# Patient Record
Sex: Male | Born: 1990 | Race: White | Hispanic: No | Marital: Single | Smoking: Former smoker
Health system: Southern US, Community
[De-identification: ages and names within clinical notes are randomized; demographics above are authoritative.]

## PROBLEM LIST (undated history)

## (undated) DIAGNOSIS — F32A Depression, unspecified: Secondary | ICD-10-CM

## (undated) DIAGNOSIS — S060X9A Concussion with loss of consciousness of unspecified duration, initial encounter: Secondary | ICD-10-CM

## (undated) DIAGNOSIS — T7840XA Allergy, unspecified, initial encounter: Secondary | ICD-10-CM

## (undated) DIAGNOSIS — K219 Gastro-esophageal reflux disease without esophagitis: Secondary | ICD-10-CM

## (undated) DIAGNOSIS — F419 Anxiety disorder, unspecified: Secondary | ICD-10-CM

## (undated) DIAGNOSIS — S060XAA Concussion with loss of consciousness status unknown, initial encounter: Secondary | ICD-10-CM

## (undated) HISTORY — DX: Allergy, unspecified, initial encounter: T78.40XA

## (undated) HISTORY — DX: Anxiety disorder, unspecified: F41.9

## (undated) HISTORY — DX: Depression, unspecified: F32.A

---

## 2017-10-13 ENCOUNTER — Emergency Department
Admission: EM | Admit: 2017-10-13 | Discharge: 2017-10-13 | Disposition: A | Discharge status: Home / Self Care | Payer: Self-pay | Attending: Student in an Organized Health Care Education/Training Program | Admitting: Student in an Organized Health Care Education/Training Program

## 2017-10-13 ENCOUNTER — Other Ambulatory Visit: Payer: Self-pay

## 2017-10-13 ENCOUNTER — Encounter: Payer: Self-pay | Admitting: Emergency Medicine

## 2017-10-13 DIAGNOSIS — R112 Nausea with vomiting, unspecified: Secondary | ICD-10-CM | POA: Insufficient documentation

## 2017-10-13 DIAGNOSIS — Z87891 Personal history of nicotine dependence: Secondary | ICD-10-CM | POA: Insufficient documentation

## 2017-10-13 HISTORY — DX: Concussion with loss of consciousness status unknown, initial encounter: S06.0XAA

## 2017-10-13 HISTORY — DX: Concussion with loss of consciousness of unspecified duration, initial encounter: S06.0X9A

## 2017-10-13 LAB — URINALYSIS, COMPLETE (UACMP) WITH MICROSCOPIC
BACTERIA UA: NONE SEEN
Bilirubin Urine: NEGATIVE
Glucose, UA: NEGATIVE mg/dL
HGB URINE DIPSTICK: NEGATIVE
Ketones, ur: 20 mg/dL — AB
Leukocytes, UA: NEGATIVE
Nitrite: NEGATIVE
PROTEIN: NEGATIVE mg/dL
Specific Gravity, Urine: 1.019 (ref 1.005–1.030)
pH: 6 (ref 5.0–8.0)

## 2017-10-13 LAB — COMPREHENSIVE METABOLIC PANEL
ALT: 32 U/L (ref 17–63)
AST: 24 U/L (ref 15–41)
Albumin: 4.8 g/dL (ref 3.5–5.0)
Alkaline Phosphatase: 62 U/L (ref 38–126)
Anion gap: 9 (ref 5–15)
BUN: 15 mg/dL (ref 6–20)
CO2: 24 mmol/L (ref 22–32)
Calcium: 9.8 mg/dL (ref 8.9–10.3)
Chloride: 105 mmol/L (ref 101–111)
Creatinine, Ser: 1.01 mg/dL (ref 0.61–1.24)
GFR calc Af Amer: >60 mL/min (ref >60)
GFR calc non Af Amer: >60 mL/min (ref >60)
Glucose, Bld: 116 mg/dL — ABNORMAL HIGH (ref 65–99)
Potassium: 3.9 mmol/L (ref 3.5–5.1)
Sodium: 138 mmol/L (ref 135–145)
Total Bilirubin: 1.3 mg/dL — ABNORMAL HIGH (ref 0.3–1.2)
Total Protein: 8.1 g/dL (ref 6.5–8.1)

## 2017-10-13 LAB — LIPASE, BLOOD: Lipase: 17 U/L (ref 11–51)

## 2017-10-13 LAB — CBC
HCT: 48.6 % (ref 40.0–52.0)
Hemoglobin: 16.8 g/dL (ref 13.0–18.0)
MCH: 31.9 pg (ref 26.0–34.0)
MCHC: 34.5 g/dL (ref 32.0–36.0)
MCV: 92.2 fL (ref 80.0–100.0)
Platelets: 208 10*3/uL (ref 150–440)
RBC: 5.27 MIL/uL (ref 4.40–5.90)
RDW: 12.6 % (ref 11.5–14.5)
WBC: 10.7 10*3/uL — ABNORMAL HIGH (ref 3.8–10.6)

## 2017-10-13 MED ORDER — SODIUM CHLORIDE 0.9 % IV BOLUS (SEPSIS)
1000.0000 mL | Freq: Once | INTRAVENOUS | Status: AC
  Administered 2017-10-13: 1000 mL via INTRAVENOUS

## 2017-10-13 MED ORDER — PROMETHAZINE HCL 12.5 MG PO TABS: 12.5000 mg | ORAL_TABLET | Freq: Four times a day (QID) | ORAL | 0 refills | Status: DC | PRN

## 2017-10-13 MED ORDER — PROMETHAZINE HCL 25 MG/ML IJ SOLN: 12.5000 mg | Freq: Four times a day (QID) | INTRAMUSCULAR | Status: DC | PRN

## 2017-10-13 NOTE — ED Provider Notes (Signed)
[facility] Emergency Department Provider Note    First MD Initiated Contact with Patient 10/13/17 1457     (approximate)  I have reviewed the triage vital signs and the nursing notes.   HISTORY  Chief Complaint Emesis    HPI Brian Gordon is a 27 y.o. male presents with 3 days of nausea vomiting and feelings of dehydration.  No Diarrhea.  No fevers.  States that he has had several friends with similar symptoms.  No chest pain or shortness of breath.  Has not tried anything for nausea.  Patient woke up this morning and was feeling lightheaded when standing and was worried that he was dehydrated.  He tried to drink half a gallon of milk which caused him to vomit.  Past Medical History:  Diagnosis Date  . Concussion    History reviewed. No pertinent family history. History reviewed. No pertinent surgical history. There are no active problems to display for this patient.     Prior to Admission medications   Not on File    Allergies Cherry    Social History Social History   Tobacco Use  . Smoking status: Former Games developer  . Smokeless tobacco: Never Used  Substance Use Topics  . Alcohol use: Yes    Frequency: Never  . Drug use: No    Review of Systems Patient denies headaches, rhinorrhea, blurry vision, numbness, shortness of breath, chest pain, edema, cough, abdominal pain, nausea, vomiting, diarrhea, dysuria, fevers, rashes or hallucinations unless otherwise stated above in HPI. ____________________________________________   PHYSICAL EXAM:  VITAL SIGNS: Vitals:   10/13/17 1421  BP: 129/81  Pulse: (!) 103  Resp: 16  Temp: 98.1 F (36.7 C)  SpO2: 96%    Constitutional: Alert and oriented. Well appearing and in no acute distress. Eyes: Conjunctivae are normal.  Head: Atraumatic. Nose: No congestion/rhinnorhea. Mouth/Throat: Mucous membranes are moist.   Neck: Painless ROM.  Cardiovascular:   Good peripheral  circulation. Respiratory: Normal respiratory effort.  No retractions.  Gastrointestinal: Soft and nontender.  Musculoskeletal: No lower extremity tenderness .  No joint effusions. Neurologic:  Normal speech and language. No gross focal neurologic deficits are appreciated.  Skin:  Skin is warm, dry and intact. No rash noted. Psychiatric: Mood and affect are normal. Speech and behavior are normal.  ____________________________________________   LABS (all labs ordered are listed, but only abnormal results are displayed)  Results for orders placed or performed during the hospital encounter of 10/13/17 (from the past 24 hour(s))  Lipase, blood     Status: None   Collection Time: 10/13/17  2:20 PM  Result Value Ref Range   Lipase 17 11 - 51 U/L  Comprehensive metabolic panel     Status: Abnormal   Collection Time: 10/13/17  2:20 PM  Result Value Ref Range   Sodium 138 135 - 145 mmol/L   Potassium 3.9 3.5 - 5.1 mmol/L   Chloride 105 101 - 111 mmol/L   CO2 24 22 - 32 mmol/L   Glucose, Bld 116 (H) 65 - 99 mg/dL   BUN 15 6 - 20 mg/dL   Creatinine, Ser 1.61 0.61 - 1.24 mg/dL   Calcium 9.8 8.9 - 09.6 mg/dL   Total Protein 8.1 6.5 - 8.1 g/dL   Albumin 4.8 3.5 - 5.0 g/dL   AST 24 15 - 41 U/L   ALT 32 17 - 63 U/L   Alkaline Phosphatase 62 38 - 126 U/L   Total Bilirubin 1.3 (H) 0.3 -  1.2 mg/dL   GFR calc non Af Amer >60 >60 mL/min   GFR calc Af Amer >60 >60 mL/min   Anion gap 9 5 - 15  CBC     Status: Abnormal   Collection Time: 10/13/17  2:20 PM  Result Value Ref Range   WBC 10.7 (H) 3.8 - 10.6 K/uL   RBC 5.27 4.40 - 5.90 MIL/uL   Hemoglobin 16.8 13.0 - 18.0 g/dL   HCT 86.548.6 78.440.0 - 69.652.0 %   MCV 92.2 80.0 - 100.0 fL   MCH 31.9 26.0 - 34.0 pg   MCHC 34.5 32.0 - 36.0 g/dL   RDW 29.512.6 28.411.5 - 13.214.5 %   Platelets 208 150 - 440 K/uL  Urinalysis, Complete w Microscopic     Status: Abnormal   Collection Time: 10/13/17  2:20 PM  Result Value Ref Range   Color, Urine YELLOW (A) YELLOW    APPearance CLEAR (A) CLEAR   Specific Gravity, Urine 1.019 1.005 - 1.030   pH 6.0 5.0 - 8.0   Glucose, UA NEGATIVE NEGATIVE mg/dL   Hgb urine dipstick NEGATIVE NEGATIVE   Bilirubin Urine NEGATIVE NEGATIVE   Ketones, ur 20 (A) NEGATIVE mg/dL   Protein, ur NEGATIVE NEGATIVE mg/dL   Nitrite NEGATIVE NEGATIVE   Leukocytes, UA NEGATIVE NEGATIVE   RBC / HPF 0-5 0 - 5 RBC/hpf   WBC, UA 0-5 0 - 5 WBC/hpf   Bacteria, UA NONE SEEN NONE SEEN   Squamous Epithelial / LPF 0-5 (A) NONE SEEN   Mucus PRESENT    ____________________________________________ ____________________________________________   PROCEDURES  Procedure(s) performed:  Procedures    Critical Care performed: no ____________________________________________   INITIAL IMPRESSION / ASSESSMENT AND PLAN / ED COURSE  Pertinent labs & imaging results that were available during my care of the patient were reviewed by me and considered in my medical decision making (see chart for details).  DDX: Dehydration, enteritis, gastritis, food poisoning, appendicitis  Brian Gordon is a 27 y.o. who presents to the ED with his as described above.  Symptoms improved with IV fluids and IV antiemetics.  Blood work is reassuring.  Not clinically consistent with appendicitis or acute intra-abdominal process.  Patient was able to tolerate PO and was able to ambulate with a steady gait.  Have discussed with the patient and available family all diagnostics and treatments performed thus far and all questions were answered to the best of my ability. The patient demonstrates understanding and agreement with plan.       ____________________________________________   FINAL CLINICAL IMPRESSION(S) / ED DIAGNOSES  Final diagnoses:  Intractable vomiting with nausea, unspecified vomiting type      NEW MEDICATIONS STARTED DURING THIS VISIT:  This SmartLink is deprecated. Use AVSMEDLIST instead to display the medication list for a  patient.   Note:  This document was prepared using Dragon voice recognition software and may include unintentional dictation errors.     Willy Eddyobinson, Dao Mearns, MD 10/13/17 1550

## 2017-10-13 NOTE — ED Notes (Signed)
FIRST NURSE NOTE: to ER via POV. Pt provided wheelchair upon entering.

## 2017-10-13 NOTE — ED Triage Notes (Signed)
Here for vomiting for last 3 days. Reports cannot keep anything down per pt. Ambulatory with steady gait. "I drank half a gallon of milk and it came back up.  No specific pain. "I know I am dehydrated"

## 2017-11-04 ENCOUNTER — Other Ambulatory Visit: Payer: Self-pay

## 2017-11-04 ENCOUNTER — Emergency Department
Admission: EM | Admit: 2017-11-04 | Discharge: 2017-11-04 | Disposition: A | Discharge status: Home / Self Care | Payer: Self-pay | Attending: Student in an Organized Health Care Education/Training Program | Admitting: Student in an Organized Health Care Education/Training Program

## 2017-11-04 ENCOUNTER — Emergency Department: Payer: Self-pay

## 2017-11-04 ENCOUNTER — Encounter: Payer: Self-pay | Admitting: Radiology

## 2017-11-04 DIAGNOSIS — Z23 Encounter for immunization: Secondary | ICD-10-CM | POA: Insufficient documentation

## 2017-11-04 DIAGNOSIS — Z87891 Personal history of nicotine dependence: Secondary | ICD-10-CM | POA: Insufficient documentation

## 2017-11-04 DIAGNOSIS — L03113 Cellulitis of right upper limb: Secondary | ICD-10-CM | POA: Insufficient documentation

## 2017-11-04 DIAGNOSIS — W5501XA Bitten by cat, initial encounter: Secondary | ICD-10-CM | POA: Insufficient documentation

## 2017-11-04 LAB — CBC WITH DIFFERENTIAL/PLATELET
BASOS ABS: 0 10*3/uL (ref 0–0.1)
BASOS PCT: 0 %
EOS ABS: 0 10*3/uL (ref 0–0.7)
Eosinophils Relative: 0 %
HCT: 46.9 % (ref 40.0–52.0)
HEMOGLOBIN: 16.5 g/dL (ref 13.0–18.0)
Lymphocytes Relative: 20 %
Lymphs Abs: 1.8 10*3/uL (ref 1.0–3.6)
MCH: 31.6 pg (ref 26.0–34.0)
MCHC: 35.2 g/dL (ref 32.0–36.0)
MCV: 89.8 fL (ref 80.0–100.0)
Monocytes Absolute: 0.8 10*3/uL (ref 0.2–1.0)
Monocytes Relative: 9 %
NEUTROS PCT: 71 %
Neutro Abs: 6.3 10*3/uL (ref 1.4–6.5)
PLATELETS: 226 10*3/uL (ref 150–440)
RBC: 5.23 MIL/uL (ref 4.40–5.90)
RDW: 12.1 % (ref 11.5–14.5)
WBC: 8.9 10*3/uL (ref 3.8–10.6)

## 2017-11-04 LAB — COMPREHENSIVE METABOLIC PANEL
ALBUMIN: 4.6 g/dL (ref 3.5–5.0)
ALK PHOS: 56 U/L (ref 38–126)
ALT: 28 U/L (ref 17–63)
AST: 21 U/L (ref 15–41)
Anion gap: 10 (ref 5–15)
BUN: 12 mg/dL (ref 6–20)
CALCIUM: 9.5 mg/dL (ref 8.9–10.3)
CHLORIDE: 104 mmol/L (ref 101–111)
CO2: 25 mmol/L (ref 22–32)
CREATININE: 0.9 mg/dL (ref 0.61–1.24)
GFR calc Af Amer: >60 mL/min (ref >60)
GFR calc non Af Amer: >60 mL/min (ref >60)
GLUCOSE: 101 mg/dL — AB (ref 65–99)
Potassium: 4.1 mmol/L (ref 3.5–5.1)
SODIUM: 139 mmol/L (ref 135–145)
Total Bilirubin: 1 mg/dL (ref 0.3–1.2)
Total Protein: 8 g/dL (ref 6.5–8.1)

## 2017-11-04 LAB — LACTIC ACID, PLASMA: LACTIC ACID, VENOUS: 0.9 mmol/L (ref 0.5–1.9)

## 2017-11-04 MED ORDER — TETANUS-DIPHTH-ACELL PERTUSSIS 5-2.5-18.5 LF-MCG/0.5 IM SUSP
0.5000 mL | Freq: Once | INTRAMUSCULAR | Status: AC
  Administered 2017-11-04: 0.5 mL via INTRAMUSCULAR
  Filled 2017-11-04: qty 0.5

## 2017-11-04 MED ORDER — PIPERACILLIN-TAZOBACTAM 4.5 G IVPB: 4.5000 g | Freq: Once | INTRAVENOUS | Status: DC

## 2017-11-04 MED ORDER — HYDROCODONE-ACETAMINOPHEN 5-325 MG PO TABS: 1.0000 | ORAL_TABLET | ORAL | 0 refills | Status: DC | PRN

## 2017-11-04 MED ORDER — SODIUM CHLORIDE 0.9 % IV BOLUS (SEPSIS)
1000.0000 mL | Freq: Once | INTRAVENOUS | Status: AC
  Administered 2017-11-04: 1000 mL via INTRAVENOUS

## 2017-11-04 MED ORDER — CLINDAMYCIN HCL 300 MG PO CAPS: 300.0000 mg | ORAL_CAPSULE | Freq: Four times a day (QID) | ORAL | 0 refills | Status: DC

## 2017-11-04 MED ORDER — PIPERACILLIN SOD-TAZOBACTAM SO 2.25 (2-0.25) G IV SOLR
Freq: Once | INTRAVENOUS | Status: AC
  Administered 2017-11-04: 21:00:00 via INTRAVENOUS
  Filled 2017-11-04: qty 4.5

## 2017-11-04 MED ORDER — MORPHINE SULFATE (PF) 4 MG/ML IV SOLN
4.0000 mg | Freq: Once | INTRAVENOUS | Status: AC
  Administered 2017-11-04: 4 mg via INTRAVENOUS
  Filled 2017-11-04: qty 1

## 2017-11-04 MED ORDER — ONDANSETRON HCL 4 MG/2ML IJ SOLN
4.0000 mg | Freq: Once | INTRAMUSCULAR | Status: AC
  Administered 2017-11-04: 4 mg via INTRAVENOUS
  Filled 2017-11-04: qty 2

## 2017-11-04 MED ORDER — IOPAMIDOL (ISOVUE-370) INJECTION 76%
75.0000 mL | Freq: Once | INTRAVENOUS | Status: AC | PRN
  Administered 2017-11-04: 75 mL via INTRAVENOUS
  Filled 2017-11-04: qty 75

## 2017-11-04 NOTE — ED Notes (Signed)
Pt to CT via stretcher accomp by CT tech 

## 2017-11-04 NOTE — ED Notes (Signed)
Pt sitting in exam room with no distress noted; reports approx day ago he was scratched by his cat; superficial scratches noted to left upper arm and right wrist; also small puncture just at palmar side base of right 5th finger; no redness/swelling/drainage but reports having pain to right hand; pt is unsure of last tetanus; st his cat is UTD on vaccines

## 2017-11-04 NOTE — ED Triage Notes (Signed)
Pt in with co cat bite to right hand small puncture wound to palm of right hand, co pain to area. Cat is up to date on vaccinations.

## 2017-11-04 NOTE — ED Provider Notes (Signed)
[facility] Emergency Department Provider Note  ____________________________________________  Time seen: Approximately 10:47 PM  I have reviewed the triage vital signs and the nursing notes.   HISTORY  Chief Complaint Animal Bite    HPI Brian Gordon is a 27 y.o. male who presents the emergency department complaining of a cat bite to his right hand.  Patient reports that he had picked up the family cat 2 days prior.  Patient reports that the cat is typically spastic if not held closely to the chest.  Patient reports that the cat became extremely agitated because he was not how close it.  The cat scratched and bit the patient's hand.  Initially, the patient washed on his hand did not think anything of it.  He reported some pain with flexion of the second digit, as there was a puncture wound just proximal to the MCP joint in this area.  No loss of range of motion.  Patient reports over the past 2 days he has had an increase in pain, increase in erythema and edema that extends into the digit.  At this time, patient both reports pain as well as slight loss of flexion to the digit.  No systemic complaints of fevers or chills, chest pain, shortness of breath no abdominal pain, nausea or vomiting.  No medications for this complaint prior to arrival.  Past Medical History:  Diagnosis Date  . Concussion     There are no active problems to display for this patient.   No past surgical history on file.  Prior to Admission medications   Medication Sig Start Date End Date Taking? Authorizing Provider  clindamycin (CLEOCIN) 300 MG capsule Take 1 capsule (300 mg total) by mouth 4 (four) times daily. 11/04/17   Cuthriell, Delorise RoyalsJonathan D, PA-C  HYDROcodone-acetaminophen (NORCO/VICODIN) 5-325 MG tablet Take 1 tablet by mouth every 4 (four) hours as needed for moderate pain. 11/04/17   Cuthriell, Delorise RoyalsJonathan D, PA-C  promethazine (PHENERGAN) 12.5 MG tablet Take 1 tablet (12.5 mg total) by  mouth every 6 (six) hours as needed for nausea or vomiting. 10/13/17   Willy Eddyobinson, Patrick, MD    Allergies Valentino Saxonherry  No family history on file.  Social History Social History   Tobacco Use  . Smoking status: Former Games developermoker  . Smokeless tobacco: Never Used  Substance Use Topics  . Alcohol use: Yes    Frequency: Never  . Drug use: No     Review of Systems  Constitutional: No fever/chills Eyes: No visual changes. No discharge ENT: No upper respiratory complaints. Cardiovascular: no chest pain. Respiratory: no cough. No SOB. Gastrointestinal: No abdominal pain.  No nausea, no vomiting.  No diarrhea.  No constipation. Musculoskeletal: For Bite to the right hand.  Puncture wound reported just proximal to the MCP joint palmar aspect of the hand.  Surrounding erythema and edema, slight reduction in flexion of the second digit. Skin: Negative for rash, abrasions, lacerations, ecchymosis. Neurological: Negative for headaches, focal weakness or numbness. 10-point ROS otherwise negative.  ____________________________________________   PHYSICAL EXAM:  VITAL SIGNS: ED Triage Vitals  Enc Vitals Group     BP 11/04/17 1904 123/82     Pulse Rate 11/04/17 1904 (!) 102     Resp 11/04/17 1904 20     Temp 11/04/17 1904 97.9 F (36.6 C)     Temp Source 11/04/17 1904 Oral     SpO2 11/04/17 1904 97 %     Weight 11/04/17 1904 180 lb (81.6 kg)  Height 11/04/17 1904 6\' 1"  (1.854 m)     Head Circumference --      Peak Flow --      Pain Score 11/04/17 1903 7     Pain Loc --      Pain Edu? --      Excl. in GC? --      Constitutional: Alert and oriented. Well appearing and in no acute distress. Eyes: Conjunctivae are normal. PERRL. EOMI. Head: Atraumatic. Neck: No stridor.   Hematological/Lymphatic/Immunilogical: No cervical or axillary lymphadenopathy. Cardiovascular: Normal rate, regular rhythm. Normal S1 and S2.  Good peripheral circulation. Respiratory: Normal respiratory effort  without tachypnea or retractions. Lungs CTAB. Good air entry to the bases with no decreased or absent breath sounds. Musculoskeletal: Full range of motion to all extremities. No gross deformities appreciated.  Visualization of the right hand reveals scabbed linear lesion consistent with scratch from a cat as well as a single punctate lesion to the palmar aspect of the hand just proximal to the MCP joint of the second digit.  Patient does have surrounding erythema and edema around the puncture wound.  This extends into the digit.  There is no sausage digit, however erythema and edema stretches into the second phalanx region.  Sensation and cap refill intact all 5 digits.  Patient has good range of motion to the thumb and the third through fifth digits.  Patient does have slightly limited flexion to the second digit.  Patient has good extension.  Palpation along the flexor tendon does reveal some tenderness to palpation.  No palpable abnormality.  No fluctuance or induration.  There is no streaking up the arm.  Patient's wrist and elbow is unremarkable on exam. Neurologic:  Normal speech and language. No gross focal neurologic deficits are appreciated.  Skin:  Skin is warm, dry and intact. No rash noted. Psychiatric: Mood and affect are normal. Speech and behavior are normal. Patient exhibits appropriate insight and judgement.   ____________________________________________   LABS (all labs ordered are listed, but only abnormal results are displayed)  Labs Reviewed  COMPREHENSIVE METABOLIC PANEL - Abnormal; Notable for the following components:      Result Value   Glucose, Bld 101 (*)    All other components within normal limits  CULTURE, BLOOD (ROUTINE X 2)  CULTURE, BLOOD (ROUTINE X 2)  LACTIC ACID, PLASMA  CBC WITH DIFFERENTIAL/PLATELET   ____________________________________________  EKG   ____________________________________________  RADIOLOGY Festus Barren Cuthriell, personally viewed  and evaluated these images (plain radiographs) as part of my medical decision making, as well as reviewing the written report by the radiologist.  Ct Hand Right W Contrast  Result Date: 11/04/2017 CLINICAL DATA:  Cat bite to the right hand with small puncture wound to the palm of the hand. EXAM: CT OF THE UPPER RIGHT EXTREMITY WITH CONTRAST TECHNIQUE: Multidetector CT imaging of the upper right extremity was performed according to the standard protocol following intravenous contrast administration. COMPARISON:  None. CONTRAST:  75mL ISOVUE-370 IOPAMIDOL (ISOVUE-370) INJECTION 76% FINDINGS: Bones/Joint/Cartilage No evidence of acute fracture or dislocation. No focal bone lesion or bone destruction. No cortical erosion. Joint spaces are preserved. Ligaments Suboptimally assessed by CT. Muscles and Tendons Limited evaluation on CT. No discrete muscular mass or fluid collection identified. Soft tissues No significant soft tissue infiltration, edema, or abscess identified. No soft tissue gas collections. No radiopaque soft tissue foreign bodies. IMPRESSION: Normal examination.  No acute process identified Electronically Signed   By: Marisa Cyphers.D.  On: 11/04/2017 22:35    ____________________________________________    PROCEDURES  Procedure(s) performed:    Procedures    Medications  Tdap (BOOSTRIX) injection 0.5 mL (0.5 mLs Intramuscular Given 11/04/17 1943)  sodium chloride 0.9 % bolus 1,000 mL (1,000 mLs Intravenous New Bag/Given 11/04/17 2101)  piperacillin-tazobactam (ZOSYN) 4.5 g in dextrose 5 % 100 mL injection ( Intravenous Stopped 11/04/17 2304)  morphine 4 MG/ML injection 4 mg (4 mg Intravenous Given 11/04/17 2139)  ondansetron (ZOFRAN) injection 4 mg (4 mg Intravenous Given 11/04/17 2139)  iopamidol (ISOVUE-370) 76 % injection 75 mL (75 mLs Intravenous Contrast Given 11/04/17 2145)     ____________________________________________   INITIAL IMPRESSION / ASSESSMENT AND PLAN /  ED COURSE  Pertinent labs & imaging results that were available during my care of the patient were reviewed by me and considered in my medical decision making (see chart for details).  Review of the [place] CSRS was performed in accordance of the [facility] prior to dispensing any controlled drugs.     Patient's diagnosis is consistent with bite with surrounding cellulitis.  Patient presented to the emergency department with complaint of cat bite to the right hand with erythema, edema, and slight reduction in flexion of the second digit.  Patient did not have a sausage digit, with good neurovascular status of the affected digit, patient was evaluated with labs and imaging to rule out infectious sinusitis.  Initial differential includes cellulitis, tendon injury, infectious tenosynovitis, abscess.  CT scan reveals no appreciable abscess, gas, osteomyelitis.  At this time, patient has been treated with Zosyn in the emergency department.. Patient will be discharged home with prescriptions for clindamycin and #10 Norco.  Strict return precautions are provided to patient for any worsening.. Patient is to follow up with primary care as needed or otherwise directed. Patient is given ED precautions to return to the ED for any worsening or new symptoms.     ____________________________________________  FINAL CLINICAL IMPRESSION(S) / ED DIAGNOSES  Final diagnoses:  Cat bite, initial encounter  Cellulitis of right hand      NEW MEDICATIONS STARTED DURING THIS VISIT:  ED Discharge Orders        Ordered    clindamycin (CLEOCIN) 300 MG capsule  4 times daily     11/04/17 2258    HYDROcodone-acetaminophen (NORCO/VICODIN) 5-325 MG tablet  Every 4 hours PRN     11/04/17 2258          This chart was dictated using voice recognition software/Dragon. Despite best efforts to proofread, errors can occur which can change the meaning. Any change was purely unintentional.    Racheal Patches,  PA-C 11/04/17 2309    Willy Eddy, MD 11/04/17 [phone]

## 2017-11-09 LAB — CULTURE, BLOOD (ROUTINE X 2)
CULTURE: NO GROWTH
Culture: NO GROWTH
Special Requests: ADEQUATE
Special Requests: ADEQUATE

## 2018-04-17 ENCOUNTER — Encounter: Payer: Self-pay | Admitting: Emergency Medicine

## 2018-04-17 ENCOUNTER — Emergency Department: Payer: Self-pay

## 2018-04-17 ENCOUNTER — Emergency Department
Admission: EM | Admit: 2018-04-17 | Discharge: 2018-04-17 | Disposition: A | Discharge status: Home / Self Care | Payer: Self-pay | Attending: Emergency Medicine | Admitting: Emergency Medicine

## 2018-04-17 ENCOUNTER — Other Ambulatory Visit: Payer: Self-pay

## 2018-04-17 DIAGNOSIS — Z87891 Personal history of nicotine dependence: Secondary | ICD-10-CM | POA: Insufficient documentation

## 2018-04-17 DIAGNOSIS — R1110 Vomiting, unspecified: Secondary | ICD-10-CM | POA: Insufficient documentation

## 2018-04-17 DIAGNOSIS — K852 Alcohol induced acute pancreatitis without necrosis or infection: Secondary | ICD-10-CM | POA: Insufficient documentation

## 2018-04-17 DIAGNOSIS — K859 Acute pancreatitis without necrosis or infection, unspecified: Secondary | ICD-10-CM | POA: Insufficient documentation

## 2018-04-17 HISTORY — DX: Gastro-esophageal reflux disease without esophagitis: K21.9

## 2018-04-17 LAB — URINALYSIS, COMPLETE (UACMP) WITH MICROSCOPIC
BACTERIA UA: NONE SEEN
Bilirubin Urine: NEGATIVE
Glucose, UA: NEGATIVE mg/dL
Hgb urine dipstick: NEGATIVE
KETONES UR: NEGATIVE mg/dL
Leukocytes, UA: NEGATIVE
Nitrite: NEGATIVE
PROTEIN: NEGATIVE mg/dL
Specific Gravity, Urine: 1.019 (ref 1.005–1.030)
Squamous Epithelial / LPF: NONE SEEN (ref 0–5)
pH: 5 (ref 5.0–8.0)

## 2018-04-17 LAB — COMPREHENSIVE METABOLIC PANEL
ALT: 40 U/L (ref 0–44)
ANION GAP: 7 (ref 5–15)
AST: 27 U/L (ref 15–41)
Albumin: 4.4 g/dL (ref 3.5–5.0)
Alkaline Phosphatase: 58 U/L (ref 38–126)
BILIRUBIN TOTAL: 0.6 mg/dL (ref 0.3–1.2)
BUN: 16 mg/dL (ref 6–20)
CHLORIDE: 107 mmol/L (ref 98–111)
CO2: 26 mmol/L (ref 22–32)
Calcium: 9.2 mg/dL (ref 8.9–10.3)
Creatinine, Ser: 0.97 mg/dL (ref 0.61–1.24)
Glucose, Bld: 107 mg/dL — ABNORMAL HIGH (ref 70–99)
POTASSIUM: 4.4 mmol/L (ref 3.5–5.1)
Sodium: 140 mmol/L (ref 135–145)
TOTAL PROTEIN: 7.3 g/dL (ref 6.5–8.1)

## 2018-04-17 LAB — CBC
HEMATOCRIT: 44.6 % (ref 40.0–52.0)
Hemoglobin: 15.7 g/dL (ref 13.0–18.0)
MCH: 31.5 pg (ref 26.0–34.0)
MCHC: 35.2 g/dL (ref 32.0–36.0)
MCV: 89.5 fL (ref 80.0–100.0)
PLATELETS: 189 10*3/uL (ref 150–440)
RBC: 4.98 MIL/uL (ref 4.40–5.90)
RDW: 12.7 % (ref 11.5–14.5)
WBC: 5.7 10*3/uL (ref 3.8–10.6)

## 2018-04-17 LAB — LIPASE, BLOOD: Lipase: 74 U/L — ABNORMAL HIGH (ref 11–51)

## 2018-04-17 MED ORDER — ONDANSETRON HCL 4 MG/2ML IJ SOLN
4.0000 mg | Freq: Once | INTRAMUSCULAR | Status: AC
  Administered 2018-04-17: 4 mg via INTRAVENOUS
  Filled 2018-04-17: qty 2

## 2018-04-17 MED ORDER — OMEPRAZOLE 40 MG PO CPDR: 40.0000 mg | DELAYED_RELEASE_CAPSULE | Freq: Every day | ORAL | 1 refills | Status: DC

## 2018-04-17 MED ORDER — ACETAMINOPHEN 500 MG PO TABS
1000.0000 mg | ORAL_TABLET | Freq: Once | ORAL | Status: AC
  Administered 2018-04-17: 1000 mg via ORAL
  Filled 2018-04-17: qty 2

## 2018-04-17 MED ORDER — ONDANSETRON HCL 4 MG/2ML IJ SOLN
4.0000 mg | Freq: Once | INTRAMUSCULAR | Status: AC
Start: 2018-04-17 — End: 2018-04-17
  Administered 2018-04-17: 4 mg via INTRAVENOUS
  Filled 2018-04-17: qty 2

## 2018-04-17 MED ORDER — OXYCODONE HCL 5 MG PO TABS
5.0000 mg | ORAL_TABLET | Freq: Once | ORAL | Status: AC
  Administered 2018-04-17: 5 mg via ORAL
  Filled 2018-04-17: qty 1

## 2018-04-17 MED ORDER — ONDANSETRON 4 MG PO TBDP: 4.0000 mg | ORAL_TABLET | Freq: Three times a day (TID) | ORAL | 0 refills | Status: DC | PRN

## 2018-04-17 MED ORDER — OXYCODONE-ACETAMINOPHEN 5-325 MG PO TABS: 1.0000 | ORAL_TABLET | ORAL | 0 refills | Status: DC | PRN

## 2018-04-17 MED ORDER — FAMOTIDINE IN NACL 20-0.9 MG/50ML-% IV SOLN
20.0000 mg | Freq: Once | INTRAVENOUS | Status: AC
  Administered 2018-04-17: 20 mg via INTRAVENOUS
  Filled 2018-04-17: qty 50

## 2018-04-17 NOTE — ED Notes (Signed)
Josefine ClassBonnie Richardson (mother in law) [phone]

## 2018-04-17 NOTE — ED Notes (Signed)
Labs being redrawn at this time, as this RN received call from lab previous labs were hemolyzed.

## 2018-04-17 NOTE — ED Provider Notes (Signed)
[facility] Emergency Department Provider Note  ____________________________________________  Time seen: Approximately 12:13 PM  I have reviewed the triage vital signs and the nursing notes.   HISTORY  Chief Complaint Emesis and Abdominal Pain   HPI Brian Gordon is a 27 y.o. male with a history of GERD who presents for evaluation of epigastric burning abdominal pain and vomiting.  Patient reports that he has been suffering from GERD for several years.  Does not take any medications at home.  Yesterday he had spicy [place] food and had several alcoholic drinks.  This morning he woke up with burning in the epigastric region which has been constant mild and nonradiating.  Also had 3 episodes of emesis.  He reports that the first episode he had a small amount of red blood in it which cleared on subsequent episodes.  He also had a normal bowel movement this morning with no melena.  He is not on blood thinners.  He denies ever having hematemesis before.  No coffee-ground emesis.  Past Medical History:  Diagnosis Date  . Concussion   . GERD (gastroesophageal reflux disease)     Prior to Admission medications   Medication Sig Start Date End Date Taking? Authorizing Provider  calcium carbonate (TUMS - DOSED IN MG ELEMENTAL CALCIUM) 500 MG chewable tablet Chew 1 tablet by mouth daily as needed for indigestion or heartburn.   Yes [provider]  HYDROcodone-acetaminophen (NORCO/VICODIN) 5-325 MG tablet Take 1 tablet by mouth every 4 (four) hours as needed for moderate pain. Patient not taking: Reported on 04/17/2018 11/04/17   Cuthriell, Delorise Royals, PA-C  omeprazole (PRILOSEC) 40 MG capsule Take 1 capsule (40 mg total) by mouth daily. 04/17/18 04/17/19  Nita Sickle, MD  ondansetron (ZOFRAN ODT) 4 MG disintegrating tablet Take 1 tablet (4 mg total) by mouth every 8 (eight) hours as needed for nausea or vomiting. 04/17/18   Don Perking, [place], MD    oxyCODONE-acetaminophen (PERCOCET) 5-325 MG tablet Take 1 tablet by mouth every 4 (four) hours as needed for severe pain. 04/17/18   Nita Sickle, MD  promethazine (PHENERGAN) 12.5 MG tablet Take 1 tablet (12.5 mg total) by mouth every 6 (six) hours as needed for nausea or vomiting. Patient not taking: Reported on 04/17/2018 10/13/17   Willy Eddy, MD    Allergies Valentino Saxon  No family history on file.  Social History Social History   Tobacco Use  . Smoking status: Former Games developer  . Smokeless tobacco: Never Used  Substance Use Topics  . Alcohol use: Yes    Frequency: Never  . Drug use: No    Review of Systems  Constitutional: Negative for fever. Eyes: Negative for visual changes. ENT: Negative for sore throat. Neck: No neck pain  Cardiovascular: Negative for chest pain. Respiratory: Negative for shortness of breath. Gastrointestinal: + epigastric abdominal pain, nausea, and vomiting. No diarrhea. Genitourinary: Negative for dysuria. Musculoskeletal: Negative for back pain. Skin: Negative for rash. Neurological: Negative for headaches, weakness or numbness. Psych: No SI or HI  ____________________________________________   PHYSICAL EXAM:  VITAL SIGNS: ED Triage Vitals  Enc Vitals Group     BP 04/17/18 1142 124/79     Pulse Rate 04/17/18 1142 (!) 109     Resp 04/17/18 1142 14     Temp 04/17/18 1142 98 F (36.7 C)     Temp Source 04/17/18 1142 Oral     SpO2 04/17/18 1142 99 %     Weight 04/17/18 1145 190 lb (86.2 kg)  Height 04/17/18 1145 6\' 2"  (1.88 m)     Head Circumference --      Peak Flow --      Pain Score 04/17/18 1145 7     Pain Loc --      Pain Edu? --      Excl. in GC? --     Constitutional: Alert and oriented. Well appearing and in no apparent distress. HEENT:      Head: Normocephalic and atraumatic.         Eyes: Conjunctivae are normal. Sclera is non-icteric.       Mouth/Throat: Mucous membranes are moist.       Neck: Supple with no  signs of meningismus. Cardiovascular: Regular rate and rhythm. No murmurs, gallops, or rubs. 2+ symmetrical distal pulses are present in all extremities. No JVD. Respiratory: Normal respiratory effort. Lungs are clear to auscultation bilaterally. No wheezes, crackles, or rhonchi.  Gastrointestinal: Soft, non tender, and non distended with positive bowel sounds. No rebound or guarding. Genitourinary: No CVA tenderness. Musculoskeletal: Nontender with normal range of motion in all extremities. No edema, cyanosis, or erythema of extremities. Neurologic: Normal speech and language. Face is symmetric. Moving all extremities. No gross focal neurologic deficits are appreciated. Skin: Skin is warm, dry and intact. No rash noted. Psychiatric: Mood and affect are normal. Speech and behavior are normal.  ____________________________________________   LABS (all labs ordered are listed, but only abnormal results are displayed)  Labs Reviewed  URINALYSIS, COMPLETE (UACMP) WITH MICROSCOPIC - Abnormal; Notable for the following components:      Result Value   Color, Urine YELLOW (*)    APPearance CLEAR (*)    All other components within normal limits  COMPREHENSIVE METABOLIC PANEL - Abnormal; Notable for the following components:   Glucose, Bld 107 (*)    All other components within normal limits  LIPASE, BLOOD - Abnormal; Notable for the following components:   Lipase 74 (*)    All other components within normal limits  CBC   ____________________________________________  EKG  ED ECG REPORT I, Nita Sickle, the attending physician, personally viewed and interpreted this ECG.  Normal sinus rhythm, rate of 95, normal intervals, normal axis, no ST elevations or depressions.  Normal EKG. ____________________________________________  RADIOLOGY  I have personally reviewed the images performed during this visit and I agree with the Radiologist's read.   Interpretation by Radiologist:  Dg  Abdomen Acute W/chest  Result Date: 04/17/2018 CLINICAL DATA:  Epigastric pain and vomiting. EXAM: DG ABDOMEN ACUTE W/ 1V CHEST COMPARISON:  None FINDINGS: There is no evidence of dilated bowel loops or free intraperitoneal air. No radiopaque calculi or other significant radiographic abnormality is seen. Heart size and mediastinal contours are within normal limits. Both lungs are clear. Sclerotic density in the left iliac bone noted measuring 11 mm. Favor benign bone island. IMPRESSION: Negative abdominal radiographs.  No acute cardiopulmonary disease. Electronically Signed   By: Signa Kell M.D.   On: 04/17/2018 13:08   US Abdomen Limited Ruq  Result Date: 04/17/2018 CLINICAL DATA:  Pancreatitis. EXAM: ULTRASOUND ABDOMEN LIMITED RIGHT UPPER QUADRANT COMPARISON:  None. FINDINGS: Gallbladder: No gallstones or wall thickening visualized. No sonographic Murphy sign noted by sonographer. Common bile duct: Diameter: 4 mm Liver: No focal lesion identified. Within normal limits in parenchymal echogenicity. Portal vein is patent on color Doppler imaging with normal direction of blood flow towards the liver. IMPRESSION: Unremarkable right upper quadrant ultrasound. No evidence of cholelithiasis or biliary obstruction. Electronically Signed  By: Irish LackGlenn  Yamagata M.D.   On: 04/17/2018 15:19      ____________________________________________   PROCEDURES  Procedure(s) performed: None Procedures Critical Care performed:  None ____________________________________________   INITIAL IMPRESSION / ASSESSMENT AND PLAN / ED COURSE  27 y.o. male with a history of GERD who presents for evaluation of epigastric burning abdominal pain and hematemesis.  Patient is extremely well-appearing, hemodynamically stable, abdomen is soft with no tenderness throughout.  Presentation concerning for peptic ulcer disease versus gastritis versus pancreatitis versus gallbladder disease.  Labs are pending.  At this time most likely  gastritis or peptic ulcer disease considering having alcohol and spicy food last night.  Clinically I do not have any suspicions for perforated ulcer with soft nontender abdomen on exam.  Will give IV Zofran and Pepcid.    _________________________ 3:44 PM on 04/17/2018 -----------------------------------------  Work-up consistent with pancreatitis most likely alcohol induced.  Patient is well-appearing, pain and nausea are well controlled, tolerating p.o.  Right upper quadrant ultrasound negative for gallstone pancreatitis.  Based on room some criteria patient is low risk and will be discharged home at this time would bland diet, Percocet and Zofran.  Recommend close follow-up with primary care doctor and discussed return precautions for fever, new or worsening pain, several episodes of vomiting.   As part of my medical decision making, I reviewed the following data within the electronic MEDICAL RECORD NUMBER Nursing notes reviewed and incorporated, Labs reviewed , EKG interpreted , Old EKG reviewed, Old chart reviewed, Radiograph reviewed , Notes from prior ED visits and [place] Controlled Substance Database    Pertinent labs & imaging results that were available during my care of the patient were reviewed by me and considered in my medical decision making (see chart for details).    ____________________________________________   FINAL CLINICAL IMPRESSION(S) / ED DIAGNOSES  Final diagnoses:  Pancreatitis  Alcohol-induced acute pancreatitis without infection or necrosis      NEW MEDICATIONS STARTED DURING THIS VISIT:  ED Discharge Orders        Ordered    oxyCODONE-acetaminophen (PERCOCET) 5-325 MG tablet  Every 4 hours PRN     04/17/18 1542    ondansetron (ZOFRAN ODT) 4 MG disintegrating tablet  Every 8 hours PRN     04/17/18 1542    omeprazole (PRILOSEC) 40 MG capsule  Daily     04/17/18 1543       Note:  This document was prepared using Dragon voice recognition software and may  include unintentional dictation errors.    Don PerkingVeronese, [place], MD 04/17/18 [phone]

## 2018-04-17 NOTE — ED Notes (Signed)
Patient transported to Ultrasound 

## 2018-04-17 NOTE — ED Triage Notes (Signed)
Arrives with C/O RUQ abdominal pain and an episode of vomiting blood.  Patient states he has a history of GERD, but has never vomited blood in the past.

## 2018-09-14 ENCOUNTER — Encounter: Payer: Self-pay | Admitting: Emergency Medicine

## 2018-09-14 ENCOUNTER — Other Ambulatory Visit: Payer: Self-pay

## 2018-09-14 ENCOUNTER — Emergency Department
Admission: EM | Admit: 2018-09-14 | Discharge: 2018-09-14 | Disposition: A | Discharge status: Home / Self Care | Payer: Self-pay | Attending: Emergency Medicine | Admitting: Emergency Medicine

## 2018-09-14 DIAGNOSIS — K0889 Other specified disorders of teeth and supporting structures: Secondary | ICD-10-CM | POA: Insufficient documentation

## 2018-09-14 DIAGNOSIS — K029 Dental caries, unspecified: Secondary | ICD-10-CM | POA: Insufficient documentation

## 2018-09-14 DIAGNOSIS — Z79899 Other long term (current) drug therapy: Secondary | ICD-10-CM | POA: Insufficient documentation

## 2018-09-14 DIAGNOSIS — Z87891 Personal history of nicotine dependence: Secondary | ICD-10-CM | POA: Insufficient documentation

## 2018-09-14 MED ORDER — AMOXICILLIN 500 MG PO TABS: 500.0000 mg | ORAL_TABLET | Freq: Three times a day (TID) | ORAL | 0 refills | Status: DC

## 2018-09-14 MED ORDER — TRAMADOL HCL 50 MG PO TABS: 50.0000 mg | ORAL_TABLET | Freq: Four times a day (QID) | ORAL | 0 refills | Status: DC | PRN

## 2018-09-14 NOTE — Discharge Instructions (Signed)
Please call and schedule a dental appointment as soon as possible. You will need to be seen within the next 14 days. Return to the emergency department for symptoms that change or worsen if you're unable to schedule an appointment.  OPTIONS FOR DENTAL FOLLOW UP CARE  [place] [facility] Health and Human Services - Local Safety Net Dental Clinics http://www.ncdhhs.gov/dph/oralhealth/services/safetynetclinics.htm   [facility] ([phone])  [facility] ([phone])  [facility] ([phone])  [facility] ([phone])  [facility] ([phone]) This clinic caters to the indigent population and is on a lottery system. Location: [facility], [address], [place] Clinic Hours: Wednesdays from 6pm - 9pm, patients seen by a lottery system. For dates, call or go to [email or web address].[facility].[email or web address]/[facility]/patients/[email or web address] Services: Cleanings, fillings and simple extractions. Payment Options: DENTAL WORK IS FREE OF CHARGE. Bring proof of income or support. Best way to get seen: Arrive at 5:15 pm - this is a lottery, NOT first come/first serve, so arriving earlier will not increase your chances of being seen.     [facility] [phone] Select option 1 for emergencies   Location: [facility], [address], [place] Clinic Hours: No walk-ins accepted - call the day before to schedule an appointment. Check in times are 9:30 am and 1:30 pm. Services: Simple extractions, temporary fillings, pulpectomy/pulp debridement, uncomplicated abscess drainage. Payment Options: PAYMENT IS DUE AT THE TIME OF SERVICE.  Fee is usually $100-200, additional surgical procedures (e.g. abscess drainage) may be extra. Cash, checks, Visa/MasterCard accepted.  Can file Medicaid if patient is covered for dental - patient should call case worker to check. No  discount for [facility] patients. Best way to get seen: MUST call the day before and get onto the schedule. Can usually be seen the next 1-2 days. No walk-ins accepted.     [facility] [phone]   Location: [facility], [address], [place] Clinic Hours: M, W, Th, F 8am or 1:30pm, Tues 9a or 1:30 - first come/first served. Services: Simple extractions, temporary fillings, uncomplicated abscess drainage.  You do not need to be an [place] resident. Payment Options: PAYMENT IS DUE AT THE TIME OF SERVICE. Dental insurance, otherwise sliding scale - bring proof of income or support. Depending on income and treatment needed, cost is usually $50-200. Best way to get seen: Arrive early as it is first come/first served.     [facility] [phone]   Location: [address] Clinic Hours: Mon-Thu 8a-5p Services: Most basic dental services including extractions and fillings. Payment Options: PAYMENT IS DUE AT THE TIME OF SERVICE. Sliding scale, up to 50% off - bring proof if income or support. Medicaid with dental option accepted. Best way to get seen: Call to schedule an appointment, can usually be seen within 2 weeks OR they will try to see walk-ins - show up at 8a or 2p (you may have to wait).     [facility] [phone] [place] RESIDENTS ONLY   Location: [facility], [address], [place], [place] [address] Clinic Hours: By appointment only. Monday - Thursday 8am-5pm, Friday 8am-12pm Services: Cleanings, fillings, extractions. Payment Options: PAYMENT IS DUE AT THE TIME OF SERVICE. Cash, Visa or MasterCard. Sliding scale - $30 minimum per service. Best way to get seen: Come in to office, complete packet and make an appointment -   need proof of income or support monies for each household member and proof of [place]  residence. Usually takes about a month to get in.     [facility] [phone]   Location: [address] [place] Clinic Hours: Walk-in Urgent Care Dental Services are offered Monday-Friday mornings only. The numbers of emergencies accepted daily is limited to the number of providers available. Maximum 15 - Mondays, Wednesdays & Thursdays Maximum 10 - Tuesdays & Fridays Services: You do not need to be a [place] resident to be seen for a dental emergency. Emergencies are defined as pain, swelling, abnormal bleeding, or dental trauma. Walkins will receive x-rays if needed. NOTE: Dental cleaning is not an emergency. Payment Options: PAYMENT IS DUE AT THE TIME OF SERVICE. Minimum co-pay is $40.00 for uninsured patients. Minimum co-pay is $3.00 for Medicaid with dental coverage. Dental Insurance is accepted and must be presented at time of visit. Medicare does not cover dental. Forms of payment: Cash, credit card, checks. Best way to get seen: If not previously registered with the clinic, walk-in dental registration begins at 7:15 am and is on a first come/first serve basis. If previously registered with the clinic, call to make an appointment.     [facility] [phone] [place] RESIDENTS ONLY   Location: [address], [place], [place] Clinic Hours: Mon-Thu 10a-2p Services: Extractions only! Payment Options: FREE (donations accepted) - bring proof of income or support Best way to get seen: Call and schedule an appointment OR come at 8am on the 1st Monday of every month (except for holidays) when it is first come/first served.     [facility] [phone]   Location: [address], [place] Clinic Hours: Friday mornings Services, Payment Options, Best way to get seen: Call for info  

## 2018-09-14 NOTE — ED Provider Notes (Signed)
[facility] Emergency Department Provider Note ____________________________________________  Time seen: Approximately 4:05 PM  I have reviewed the triage vital signs and the nursing notes.   HISTORY  Chief Complaint Dental Pain   HPI Brian Gordon is a 27 y.o. male who presents to the emergency department for treatment and evaluation of dental pain that started a few days ago. He has been "swishing tequila around" and taking tylenol without relief. He does not currently have dental insurance.  Past Medical History:  Diagnosis Date  . Concussion   . GERD (gastroesophageal reflux disease)     There are no active problems to display for this patient.   History reviewed. No pertinent surgical history.  Prior to Admission medications   Medication Sig Start Date End Date Taking? Authorizing Provider  amoxicillin (AMOXIL) 500 MG tablet Take 1 tablet (500 mg total) by mouth 3 (three) times daily. 09/14/18   Alsha Meland, Rulon Eisenmengerari B, FNP  calcium carbonate (TUMS - DOSED IN MG ELEMENTAL CALCIUM) 500 MG chewable tablet Chew 1 tablet by mouth daily as needed for indigestion or heartburn.    [provider]  omeprazole (PRILOSEC) 40 MG capsule Take 1 capsule (40 mg total) by mouth daily. 04/17/18 04/17/19  Nita SickleVeronese, [place], MD  ondansetron (ZOFRAN ODT) 4 MG disintegrating tablet Take 1 tablet (4 mg total) by mouth every 8 (eight) hours as needed for nausea or vomiting. 04/17/18   Don PerkingVeronese, [place], MD  oxyCODONE-acetaminophen (PERCOCET) 5-325 MG tablet Take 1 tablet by mouth every 4 (four) hours as needed for severe pain. 04/17/18   Nita SickleVeronese, [place], MD  promethazine (PHENERGAN) 12.5 MG tablet Take 1 tablet (12.5 mg total) by mouth every 6 (six) hours as needed for nausea or vomiting. Patient not taking: Reported on 04/17/2018 10/13/17   Willy Eddyobinson, [place], MD  traMADol (ULTRAM) 50 MG tablet Take 1 tablet (50 mg total) by mouth every 6 (six) hours as needed. 09/14/18    Chinita Pesterriplett, Shareen Capwell B, FNP    Allergies Cherry  No family history on file.  Social History Social History   Tobacco Use  . Smoking status: Former Games developermoker  . Smokeless tobacco: Never Used  Substance Use Topics  . Alcohol use: Yes    Frequency: Never  . Drug use: No    Review of Systems Constitutional: Negative for fever or recent illness. ENT: Positive for dental pain. Musculoskeletal: Negative for trismus of the jaw.  Skin: Negative for wound or lesion. ____________________________________________   PHYSICAL EXAM:  VITAL SIGNS: ED Triage Vitals  Enc Vitals Group     BP 09/14/18 1552 118/63     Pulse Rate 09/14/18 1552 78     Resp 09/14/18 1552 18     Temp 09/14/18 1552 98.1 F (36.7 C)     Temp Source 09/14/18 1552 Oral     SpO2 09/14/18 1552 99 %     Weight 09/14/18 1549 185 lb (83.9 kg)     Height 09/14/18 1549 6\' 2"  (1.88 m)     Head Circumference --      Peak Flow --      Pain Score 09/14/18 1549 5     Pain Loc --      Pain Edu? --      Excl. in GC? --     Constitutional: Alert and oriented. Well appearing and in no acute distress. Eyes: Conjunctiva are clear without discharge or drainage. Mouth/Throat: Airway is patent. Periodontal Exam    Hematological/Lymphatic/Immunilogical: No palpable anterior cervical adenopathy. Respiratory: Respirations  even and unlabored. Musculoskeletal: Full ROM of the jaw. Neurologic: Awake, alert, oriented.  Skin:  No associated facial swelling. Psychiatric: Affect and behavior intact.  ____________________________________________   LABS (all labs ordered are listed, but only abnormal results are displayed)  Labs Reviewed - No data to display ____________________________________________   RADIOLOGY  Not indicated. ____________________________________________   PROCEDURES  Procedure(s) performed:   Procedures  Critical Care performed: No ____________________________________________   INITIAL  IMPRESSION / ASSESSMENT AND PLAN / ED COURSE  Brian Gordon is a 27 y.o. male who presents to the emergency department for treatment and evaluation of dental pain. He will be treated with amoxicillin and tramadol. He was given a list of community resources and encouraged to schedule an appointment when possible. He is to return to the ER for symptoms of concern if unable to schedule an appointment.  Pertinent labs & imaging results that were available during my care of the patient were reviewed by me and considered in my medical decision making (see chart for details).  ____________________________________________   FINAL CLINICAL IMPRESSION(S) / ED DIAGNOSES  Final diagnoses:  Pain, dental    New Prescriptions   AMOXICILLIN (AMOXIL) 500 MG TABLET    Take 1 tablet (500 mg total) by mouth 3 (three) times daily.   TRAMADOL (ULTRAM) 50 MG TABLET    Take 1 tablet (50 mg total) by mouth every 6 (six) hours as needed.    If controlled substance prescribed during this visit, 12 month history viewed on the [place] prior to issuing an initial prescription for Schedule II or III opiod.  Note:  This document was prepared using Dragon voice recognition software and may include unintentional dictation errors.    Chinita Pester, FNP 09/14/18 1622    Minna Antis, MD 09/14/18 2352

## 2018-09-14 NOTE — ED Triage Notes (Signed)
Tooth pain that started a few days ago, c/o sore throat as well, nad.

## 2019-04-05 ENCOUNTER — Other Ambulatory Visit: Payer: Self-pay

## 2019-04-05 ENCOUNTER — Emergency Department
Admission: EM | Admit: 2019-04-05 | Discharge: 2019-04-05 | Disposition: A | Discharge status: Home / Self Care | Payer: Self-pay | Attending: Student in an Organized Health Care Education/Training Program | Admitting: Student in an Organized Health Care Education/Training Program

## 2019-04-05 ENCOUNTER — Encounter: Payer: Self-pay | Admitting: Emergency Medicine

## 2019-04-05 ENCOUNTER — Emergency Department: Payer: Self-pay

## 2019-04-05 DIAGNOSIS — Z87891 Personal history of nicotine dependence: Secondary | ICD-10-CM | POA: Insufficient documentation

## 2019-04-05 DIAGNOSIS — M5431 Sciatica, right side: Secondary | ICD-10-CM | POA: Insufficient documentation

## 2019-04-05 DIAGNOSIS — Z79899 Other long term (current) drug therapy: Secondary | ICD-10-CM | POA: Insufficient documentation

## 2019-04-05 MED ORDER — TRAMADOL HCL 50 MG PO TABS: 50.0000 mg | ORAL_TABLET | Freq: Four times a day (QID) | ORAL | 0 refills | Status: AC | PRN

## 2019-04-05 MED ORDER — OXYCODONE-ACETAMINOPHEN 5-325 MG PO TABS
1.0000 | ORAL_TABLET | Freq: Once | ORAL | Status: AC
  Administered 2019-04-05: 1 via ORAL
  Filled 2019-04-05: qty 1

## 2019-04-05 MED ORDER — ORPHENADRINE CITRATE 30 MG/ML IJ SOLN
60.0000 mg | Freq: Two times a day (BID) | INTRAMUSCULAR | Status: DC
  Administered 2019-04-05: 60 mg via INTRAMUSCULAR
  Filled 2019-04-05: qty 2

## 2019-04-05 MED ORDER — METHYLPREDNISOLONE SODIUM SUCC 125 MG IJ SOLR
125.0000 mg | Freq: Once | INTRAMUSCULAR | Status: AC
  Administered 2019-04-05: 125 mg via INTRAMUSCULAR
  Filled 2019-04-05: qty 2

## 2019-04-05 MED ORDER — CYCLOBENZAPRINE HCL 10 MG PO TABS: 10.0000 mg | ORAL_TABLET | Freq: Three times a day (TID) | ORAL | 0 refills | Status: DC | PRN

## 2019-04-05 MED ORDER — METHYLPREDNISOLONE 4 MG PO TBPK: ORAL_TABLET | ORAL | 0 refills | Status: DC

## 2019-04-05 NOTE — ED Provider Notes (Signed)
[facility] Emergency Department Provider Note   ____________________________________________   First MD Initiated Contact with Patient 04/05/19 2048     (approximate)  I have reviewed the triage vital signs and the nursing notes.   HISTORY  Chief Complaint Back Pain and Hip Pain    HPI [place] Cullens is a 28 y.o. male patient complain of low back and right hip pain.  Patient states there is a shooting pain down to his foot.  Patient denies provocative incident for complaint.  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  Patient also complains of bruising around her right thigh.  Patient suspect insect bite to the medial right thigh.         Past Medical History:  Diagnosis Date  . Concussion   . GERD (gastroesophageal reflux disease)     There are no active problems to display for this patient.   History reviewed. No pertinent surgical history.  Prior to Admission medications   Medication Sig Start Date End Date Taking? Authorizing Provider  amoxicillin (AMOXIL) 500 MG tablet Take 1 tablet (500 mg total) by mouth 3 (three) times daily. 09/14/18   Triplett, Rulon Eisenmengerari B, FNP  calcium carbonate (TUMS - DOSED IN MG ELEMENTAL CALCIUM) 500 MG chewable tablet Chew 1 tablet by mouth daily as needed for indigestion or heartburn.    [provider]  cyclobenzaprine (FLEXERIL) 10 MG tablet Take 1 tablet (10 mg total) by mouth 3 (three) times daily as needed. 04/05/19   Joni ReiningSmith,  K, PA-C  methylPREDNISolone (MEDROL DOSEPAK) 4 MG TBPK tablet Take Tapered dose as directed 04/05/19   Joni ReiningSmith,  K, PA-C  omeprazole (PRILOSEC) 40 MG capsule Take 1 capsule (40 mg total) by mouth daily. 04/17/18 04/17/19  Nita SickleVeronese, [place], MD  ondansetron (ZOFRAN ODT) 4 MG disintegrating tablet Take 1 tablet (4 mg total) by mouth every 8 (eight) hours as needed for nausea or vomiting. 04/17/18   Don PerkingVeronese, [place], MD  oxyCODONE-acetaminophen  (PERCOCET) 5-325 MG tablet Take 1 tablet by mouth every 4 (four) hours as needed for severe pain. 04/17/18   Nita SickleVeronese, [place], MD  promethazine (PHENERGAN) 12.5 MG tablet Take 1 tablet (12.5 mg total) by mouth every 6 (six) hours as needed for nausea or vomiting. Patient not taking: Reported on 04/17/2018 10/13/17   Willy Eddyobinson, [place], MD  traMADol (ULTRAM) 50 MG tablet Take 1 tablet (50 mg total) by mouth every 6 (six) hours as needed. 09/14/18   Triplett, Rulon Eisenmengerari B, FNP  traMADol (ULTRAM) 50 MG tablet Take 1 tablet (50 mg total) by mouth every 6 (six) hours as needed for up to 3 days. 04/05/19 04/08/19  Joni ReiningSmith,  K, PA-C    Allergies Cherry  No family history on file.  Social History Social History   Tobacco Use  . Smoking status: Former Games developermoker  . Smokeless tobacco: Never Used  Substance Use Topics  . Alcohol use: Yes    Frequency: Never  . Drug use: No    Review of Systems  Constitutional: No fever/chills Eyes: No visual changes. ENT: No sore throat. Cardiovascular: Denies chest pain. Respiratory: Denies shortness of breath. Gastrointestinal: No abdominal pain.  No nausea, no vomiting.  No diarrhea.  No constipation. Genitourinary: Negative for dysuria. Musculoskeletal: Back and right hip pain.   Skin: Negative for rash. Neurological: Negative for headaches, focal weakness or numbness. Allergic/Immunilogical: Valentino Saxonherry ____________________________________________   PHYSICAL EXAM:  VITAL SIGNS: ED Triage Vitals  Enc Vitals Group  BP 04/05/19 2023 131/69     Pulse Rate 04/05/19 2023 (!) 108     Resp 04/05/19 2023 20     Temp 04/05/19 2023 99.1 F (37.3 C)     Temp Source 04/05/19 2023 Oral     SpO2 04/05/19 2023 98 %     Weight 04/05/19 2022 180 lb (81.6 kg)     Height 04/05/19 2022 6\' 2"  (1.88 m)     Head Circumference --      Peak Flow --      Pain Score 04/05/19 2022 6     Pain Loc --      Pain Edu? --      Excl. in [place]? --     Constitutional: Alert and  oriented. Well appearing and in no acute distress. Hematological/Lymphatic/Immunilogical: No cervical lymphadenopathy. Cardiovascular: Normal rate, regular rhythm. Grossly normal heart sounds.  Good peripheral circulation. Respiratory: Normal respiratory effort.  No retractions. Lungs CTAB. Musculoskeletal: No obvious spinal or right hip deformity.  Patient has full equal range of motion of the lumbar spine.  There is no leg length discrepancy.  Patient has full equal range of motion.  Patient has moderate guarding at the greater trochanter area.  Patient had negative straight leg test.  Neurologic:  Normal speech and language. No gross focal neurologic deficits are appreciated. No gait instability. Skin:  Skin is warm, dry and intact. No rash noted. Psychiatric: Mood and affect are normal. Speech and behavior are normal.  ____________________________________________   LABS (all labs ordered are listed, but only abnormal results are displayed)  Labs Reviewed - No data to display ____________________________________________  EKG   ____________________________________________  RADIOLOGY  ED MD interpretation:    Official radiology report(s): Dg Lumbar Spine 2-3 Views  Result Date: 04/05/2019 CLINICAL DATA:  28 year old male with back and right hip pain radiating to the right lower extremity. EXAM: LUMBAR SPINE - 2-3 VIEW COMPARISON:  None. FINDINGS: Bone mineralization is within normal limits. Normal lumbar segmentation. Normal vertebral height and alignment. Preserved disc spaces. 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 Negative visible sacrum, SI joints and abdominal visceral contours. IMPRESSION: Negative radiographic appearance of the lumbar spine. Electronically Signed   By: Genevie Ann M.D.   On: 04/05/2019 21:40   Dg Hip Unilat W Or Wo Pelvis 2-3 Views Right  Result Date: 04/05/2019 CLINICAL DATA:  28 year old male with back and right hip pain radiating to the right lower  extremity. EXAM: DG HIP (WITH OR WITHOUT PELVIS) 2-3V RIGHT COMPARISON:  Lumbar radiographs today. Abdominal radiographs 04/17/2018. FINDINGS: Benign bone island of the left iliac wing is stable. Femoral heads are normally located. There is mild bilateral acetabular spurring and subchondral sclerosis but hip joint spaces are preserved. Proximal right femur is intact and appears normal. Intact pelvis. SI joints appear normal. Mild degenerative spurring at the pubic symphysis is chronic. IMPRESSION: No acute osseous abnormality identified about the right hip or pelvis. Electronically Signed   By: Genevie Ann M.D.   On: 04/05/2019 21:42    ____________________________________________   PROCEDURES  Procedure(s) performed (including Critical Care):  Procedures   ____________________________________________   INITIAL IMPRESSION / ASSESSMENT AND PLAN / ED COURSE  As part of my medical decision making, I reviewed the following data within the electronic MEDICAL RECORD NUMBER         Martinique Blethen was evaluated in Emergency Department on 04/05/2019 for the symptoms described in the history of present illness. He was evaluated in the  context of the global COVID-19 pandemic, which necessitated consideration that the patient might be at risk for infection with the SARS-CoV-2 virus that causes COVID-19. Institutional protocols and algorithms that pertain to the evaluation of patients at risk for COVID-19 are in a state of rapid change based on information released by regulatory bodies including the CDC and federal and state organizations. These policies and algorithms were followed during the patient's care in the ED.    Patient complain of radicular low back pain to the right lower extremity.  No provocative incident for complaint.  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  Discussed no acute findings on x-ray of the lumbar spine and right hip.  Patient given discharge care instruction advised take  medication as directed.  Patient is advised [facility]. ____________________________________________   FINAL CLINICAL IMPRESSION(S) / ED DIAGNOSES  Final diagnoses:  Sciatica of right side     ED Discharge Orders         Ordered    methylPREDNISolone (MEDROL DOSEPAK) 4 MG TBPK tablet     04/05/19 2211    cyclobenzaprine (FLEXERIL) 10 MG tablet  3 times daily PRN     04/05/19 2211    traMADol (ULTRAM) 50 MG tablet  Every 6 hours PRN     04/05/19 2211           Note:  This document was prepared using Dragon voice recognition software and may include unintentional dictation errors.    Joni ReiningSmith,  K, PA-C 04/05/19 2303    Willy Eddyobinson, [place], MD 04/05/19 [phone]

## 2019-04-05 NOTE — ED Triage Notes (Addendum)
Patient to ER for c/o right sided hip and lower back pain. Patient reports pain shoots down to his foot. Denies any known injury or heavy lifting, but works in a [employer], manufacturing systems. Patient also c/o red spot with bruising around it at right thigh/groin area.

## 2019-06-18 ENCOUNTER — Encounter (INDEPENDENT_AMBULATORY_CARE_PROVIDER_SITE_OTHER): Payer: Self-pay

## 2019-06-18 ENCOUNTER — Emergency Department: Payer: Self-pay

## 2019-06-18 ENCOUNTER — Other Ambulatory Visit: Payer: Self-pay

## 2019-06-18 ENCOUNTER — Emergency Department
Admission: EM | Admit: 2019-06-18 | Discharge: 2019-06-18 | Disposition: A | Discharge status: Home / Self Care | Payer: Self-pay | Attending: Emergency Medicine | Admitting: Emergency Medicine

## 2019-06-18 DIAGNOSIS — Z5321 Procedure and treatment not carried out due to patient leaving prior to being seen by health care provider: Secondary | ICD-10-CM | POA: Insufficient documentation

## 2019-06-18 DIAGNOSIS — R079 Chest pain, unspecified: Secondary | ICD-10-CM | POA: Insufficient documentation

## 2019-06-18 LAB — CBC
HCT: 45.8 % (ref 39.0–52.0)
Hemoglobin: 15.9 g/dL (ref 13.0–17.0)
MCH: 31.5 pg (ref 26.0–34.0)
MCHC: 34.7 g/dL (ref 30.0–36.0)
MCV: 90.7 fL (ref 80.0–100.0)
Platelets: 205 10*3/uL (ref 150–400)
RBC: 5.05 MIL/uL (ref 4.22–5.81)
RDW: 12.2 % (ref 11.5–15.5)
WBC: 8.9 10*3/uL (ref 4.0–10.5)
nRBC: 0 % (ref 0.0–0.2)

## 2019-06-18 LAB — BASIC METABOLIC PANEL
Anion gap: 9 (ref 5–15)
BUN: 15 mg/dL (ref 6–20)
CO2: 24 mmol/L (ref 22–32)
Calcium: 9.4 mg/dL (ref 8.9–10.3)
Chloride: 108 mmol/L (ref 98–111)
Creatinine, Ser: 1 mg/dL (ref 0.61–1.24)
GFR calc Af Amer: >60 mL/min (ref >60)
GFR calc non Af Amer: >60 mL/min (ref >60)
Glucose, Bld: 124 mg/dL — ABNORMAL HIGH (ref 70–99)
Potassium: 4.1 mmol/L (ref 3.5–5.1)
Sodium: 141 mmol/L (ref 135–145)

## 2019-06-18 LAB — TROPONIN I (HIGH SENSITIVITY): Troponin I (High Sensitivity): <2 ng/L (ref <18)

## 2019-06-18 NOTE — ED Notes (Signed)
Per registration patient came to desk to state he was leaving.

## 2019-06-18 NOTE — ED Triage Notes (Signed)
Pt presents via POV c/o tachycardia and chest spasms. Reports tachy 130-140s in past 2 weeks. Reports today felt flutter in chest and loss of Hand-eye coordination. A&Ox4 in triage.

## 2020-01-28 ENCOUNTER — Emergency Department
Admission: EM | Admit: 2020-01-28 | Discharge: 2020-01-28 | Disposition: A | Discharge status: Home / Self Care | Payer: Self-pay | Attending: Emergency Medicine | Admitting: Emergency Medicine

## 2020-01-28 ENCOUNTER — Other Ambulatory Visit: Payer: Self-pay

## 2020-01-28 ENCOUNTER — Encounter: Payer: Self-pay | Admitting: Emergency Medicine

## 2020-01-28 DIAGNOSIS — F17200 Nicotine dependence, unspecified, uncomplicated: Secondary | ICD-10-CM | POA: Insufficient documentation

## 2020-01-28 DIAGNOSIS — K219 Gastro-esophageal reflux disease without esophagitis: Secondary | ICD-10-CM | POA: Insufficient documentation

## 2020-01-28 DIAGNOSIS — R112 Nausea with vomiting, unspecified: Secondary | ICD-10-CM | POA: Insufficient documentation

## 2020-01-28 DIAGNOSIS — R1084 Generalized abdominal pain: Secondary | ICD-10-CM | POA: Insufficient documentation

## 2020-01-28 LAB — CBC
HCT: 46.2 % (ref 39.0–52.0)
Hemoglobin: 15.8 g/dL (ref 13.0–17.0)
MCH: 31.1 pg (ref 26.0–34.0)
MCHC: 34.2 g/dL (ref 30.0–36.0)
MCV: 90.9 fL (ref 80.0–100.0)
Platelets: 205 10*3/uL (ref 150–400)
RBC: 5.08 MIL/uL (ref 4.22–5.81)
RDW: 12.3 % (ref 11.5–15.5)
WBC: 5.7 10*3/uL (ref 4.0–10.5)
nRBC: 0 % (ref 0.0–0.2)

## 2020-01-28 LAB — COMPREHENSIVE METABOLIC PANEL
ALT: 14 U/L (ref 0–44)
AST: 15 U/L (ref 15–41)
Albumin: 4.4 g/dL (ref 3.5–5.0)
Alkaline Phosphatase: 60 U/L (ref 38–126)
Anion gap: 8 (ref 5–15)
BUN: 15 mg/dL (ref 6–20)
CO2: 24 mmol/L (ref 22–32)
Calcium: 9.3 mg/dL (ref 8.9–10.3)
Chloride: 104 mmol/L (ref 98–111)
Creatinine, Ser: 0.92 mg/dL (ref 0.61–1.24)
GFR calc Af Amer: >60 mL/min (ref >60)
GFR calc non Af Amer: >60 mL/min (ref >60)
Glucose, Bld: 105 mg/dL — ABNORMAL HIGH (ref 70–99)
Potassium: 4.3 mmol/L (ref 3.5–5.1)
Sodium: 136 mmol/L (ref 135–145)
Total Bilirubin: 0.4 mg/dL (ref 0.3–1.2)
Total Protein: 7.4 g/dL (ref 6.5–8.1)

## 2020-01-28 LAB — URINALYSIS, COMPLETE (UACMP) WITH MICROSCOPIC
Bacteria, UA: NONE SEEN
Bilirubin Urine: NEGATIVE
Glucose, UA: NEGATIVE mg/dL
Hgb urine dipstick: NEGATIVE
Ketones, ur: NEGATIVE mg/dL
Leukocytes,Ua: NEGATIVE
Nitrite: NEGATIVE
Protein, ur: NEGATIVE mg/dL
Specific Gravity, Urine: 1.01 (ref 1.005–1.030)
Squamous Epithelial / HPF: NONE SEEN (ref 0–5)
pH: 7 (ref 5.0–8.0)

## 2020-01-28 LAB — LIPASE, BLOOD: Lipase: 38 U/L (ref 11–51)

## 2020-01-28 MED ORDER — DIPHENHYDRAMINE HCL 50 MG/ML IJ SOLN: 25.0000 mg | Freq: Once | INTRAMUSCULAR | Status: AC

## 2020-01-28 MED ORDER — SODIUM CHLORIDE 0.9% FLUSH: 3.0000 mL | Freq: Once | INTRAVENOUS | Status: DC

## 2020-01-28 MED ORDER — DICYCLOMINE HCL 10 MG PO CAPS: 10.0000 mg | ORAL_CAPSULE | Freq: Three times a day (TID) | ORAL | 0 refills | Status: DC | PRN

## 2020-01-28 MED ORDER — METOCLOPRAMIDE HCL 10 MG PO TABS: 10.0000 mg | ORAL_TABLET | Freq: Three times a day (TID) | ORAL | 0 refills | Status: DC | PRN

## 2020-01-28 MED ORDER — METOCLOPRAMIDE HCL 5 MG/ML IJ SOLN
10.0000 mg | Freq: Once | INTRAMUSCULAR | Status: AC
  Administered 2020-01-28: 10 mg via INTRAVENOUS
  Filled 2020-01-28: qty 2

## 2020-01-28 MED ORDER — SODIUM CHLORIDE 0.9 % IV BOLUS
1000.0000 mL | Freq: Once | INTRAVENOUS | Status: AC
  Administered 2020-01-28: 1000 mL via INTRAVENOUS

## 2020-01-28 MED ORDER — FAMOTIDINE IN NACL 20-0.9 MG/50ML-% IV SOLN
20.0000 mg | Freq: Once | INTRAVENOUS | Status: AC
  Administered 2020-01-28: 20 mg via INTRAVENOUS
  Filled 2020-01-28: qty 50

## 2020-01-28 MED ORDER — ONDANSETRON HCL 4 MG/2ML IJ SOLN
4.0000 mg | Freq: Once | INTRAMUSCULAR | Status: AC
  Administered 2020-01-28: 08:00:00 4 mg via INTRAVENOUS
  Filled 2020-01-28: qty 2

## 2020-01-28 MED ORDER — DICYCLOMINE HCL 10 MG/ML IM SOLN
20.0000 mg | Freq: Once | INTRAMUSCULAR | Status: AC
  Administered 2020-01-28: 20 mg via INTRAMUSCULAR
  Filled 2020-01-28: qty 2

## 2020-01-28 MED ORDER — DIPHENHYDRAMINE HCL 50 MG/ML IJ SOLN
INTRAMUSCULAR | Status: AC
  Administered 2020-01-28: 25 mg via INTRAVENOUS
  Filled 2020-01-28: qty 1

## 2020-01-28 NOTE — ED Notes (Signed)
This RN to bedside due to patient calling out, pt requesting to go to the bathroom, pt provided with urinal for urine sample.

## 2020-01-28 NOTE — ED Notes (Signed)
EDP made aware of patient c/o increasing nausea.

## 2020-01-28 NOTE — ED Triage Notes (Signed)
Pt to ED via POV c/o abdominal pain and vomiting x 3 days. Pt states that he has been unable to keep anything down. Pt states that pain is in the LUQ and epigastric area. Pt states that the pain is a burning pain but is worse then acid reflux. Pt states that he has vomited 4-5 times in the 24 hours. Pt denies diarrhea, states that he has actually been having trouble having a good bowel movement. Pt denies fevers or chills. Pt thinks that he may have pulled a muscle in his neck from vomiting so much.

## 2020-01-28 NOTE — ED Notes (Signed)
Pt unhooked to go to the bathroom to have a bowel movement. Pt visualized in NAD at this time.

## 2020-01-28 NOTE — ED Notes (Signed)
This RN to bedside, pt states "I feel tired and ready to go", explained would ask EDP for PO challenge. Pt states understanding. Visualized in NAD at this time. VSS at this time.

## 2020-01-28 NOTE — ED Notes (Signed)
NAD noted at time of D/C. Pt denies questions or concerns. Pt ambulatory to the lobby at this time.  

## 2020-01-28 NOTE — ED Notes (Signed)
Medications administered per EDP order for nausea. Pt requesting to know if the EDP was going to order any "scans or ultrasounds or anything". Explained will speak with EDP. Pt states decreased "spikes in pain" since administration of Bentyl.

## 2020-01-28 NOTE — ED Notes (Signed)
This RN to bedside, urinal emptied at this time. Pt requesting pain medication. Explained will speak with MD. Fluids completed at this time.

## 2020-01-28 NOTE — Discharge Instructions (Signed)
We suspect that you likely have gastritis or inflammation in the stomach lining, although your symptoms can also be caused by a stomach virus.    You may take the Reglan for nausea and the Bentyl for abdominal pain/cramping as needed over the next few days.  You should also continue to take your omeprazole.  Return to the ER for new, worsening, or persistent severe vomiting, abdominal pain, fever, weakness, or any other new or worsening symptoms that concern you.  You should follow-up with a gastroenterologist as an outpatient when you are able to.

## 2020-01-28 NOTE — ED Provider Notes (Signed)
[facility] Emergency Department Provider Note ____________________________________________   First MD Initiated Contact with Patient 01/28/20 [phone]     (approximate)  I have reviewed the triage vital signs and the nursing notes.   HISTORY  Chief Complaint Abdominal Pain and Emesis    HPI Brian Gordon is a 29 y.o. male with PMH as noted below including GERD who presents with nausea and vomiting over the last 3 days, persistent course, with about 4-5 episodes over the last day.  He denies associated diarrhea or fever.  He does have some abdominal pain, mainly a searing or burning upper abdominal pain and some crampy pain in the lower abdomen.  The patient states that he ate some chicken with spicy sauce on it and thinks that this may have set this off.  He reports that he used to drink alcohol heavily and has had intermittent stomach problems ever since then.  He denies any alcohol use in the last 2 years.  Past Medical History:  Diagnosis Date  . Concussion   . GERD (gastroesophageal reflux disease)     There are no problems to display for this patient.   History reviewed. No pertinent surgical history.  Prior to Admission medications   Medication Sig Start Date End Date Taking? Authorizing Provider  amoxicillin (AMOXIL) 500 MG tablet Take 1 tablet (500 mg total) by mouth 3 (three) times daily. 09/14/18   Triplett, Rulon Eisenmenger B, FNP  calcium carbonate (TUMS - DOSED IN MG ELEMENTAL CALCIUM) 500 MG chewable tablet Chew 1 tablet by mouth daily as needed for indigestion or heartburn.    [provider]  cyclobenzaprine (FLEXERIL) 10 MG tablet Take 1 tablet (10 mg total) by mouth 3 (three) times daily as needed. 04/05/19   Joni Reining, PA-C  dicyclomine (BENTYL) 10 MG capsule Take 1 capsule (10 mg total) by mouth 3 (three) times daily as needed for up to 3 days (abdominal pain/cramping). 01/28/20 01/31/20  Dionne Bucy, MD  methylPREDNISolone  (MEDROL DOSEPAK) 4 MG TBPK tablet Take Tapered dose as directed 04/05/19   Joni Reining, PA-C  metoCLOPramide (REGLAN) 10 MG tablet Take 1 tablet (10 mg total) by mouth every 8 (eight) hours as needed for up to 3 days for nausea or vomiting. 01/28/20 01/31/20  Dionne Bucy, MD  omeprazole (PRILOSEC) 40 MG capsule Take 1 capsule (40 mg total) by mouth daily. 04/17/18 04/17/19  Nita Sickle, MD  ondansetron (ZOFRAN ODT) 4 MG disintegrating tablet Take 1 tablet (4 mg total) by mouth every 8 (eight) hours as needed for nausea or vomiting. 04/17/18   Don Perking, [place], MD  oxyCODONE-acetaminophen (PERCOCET) 5-325 MG tablet Take 1 tablet by mouth every 4 (four) hours as needed for severe pain. 04/17/18   Nita Sickle, MD  promethazine (PHENERGAN) 12.5 MG tablet Take 1 tablet (12.5 mg total) by mouth every 6 (six) hours as needed for nausea or vomiting. Patient not taking: Reported on 04/17/2018 10/13/17   Willy Eddy, MD  traMADol (ULTRAM) 50 MG tablet Take 1 tablet (50 mg total) by mouth every 6 (six) hours as needed. 09/14/18   Chinita Pester, FNP    Allergies Cherry  No family history on file.  Social History Social History   Tobacco Use  . Smoking status: Current Every Day Smoker  . Smokeless tobacco: Never Used  Substance Use Topics  . Alcohol use: Not Currently  . Drug use: Yes    Types: Marijuana    Comment: last used a few  days ago    Review of Systems  Constitutional: No fever. Eyes: No redness. ENT: No sore throat. Cardiovascular: Denies chest pain. Respiratory: Denies shortness of breath. Gastrointestinal: Positive for nausea and vomiting. Genitourinary: Negative for dysuria.  Musculoskeletal: Negative for back pain. Skin: Negative for rash. Neurological: Negative for headache.   ____________________________________________   PHYSICAL EXAM:  VITAL SIGNS: ED Triage Vitals  Enc Vitals Group     BP 01/28/20 0757 (!) 127/94     Pulse Rate  01/28/20 0757 (!) 101     Resp 01/28/20 0757 16     Temp 01/28/20 0757 97.9 F (36.6 C)     Temp Source 01/28/20 0757 Oral     SpO2 01/28/20 0757 100 %     Weight 01/28/20 0754 175 lb (79.4 kg)     Height 01/28/20 0754 6\' 2"  (1.88 m)     Head Circumference --      Peak Flow --      Pain Score 01/28/20 0753 5     Pain Loc --      Pain Edu? --      Excl. in [place]? --     Constitutional: Alert and oriented. Well appearing and in no acute distress. Eyes: Conjunctivae are normal.  Head: Atraumatic. Nose: No congestion/rhinnorhea. Mouth/Throat: Mucous membranes are moist.   Neck: Normal range of motion.  Cardiovascular: Normal rate, regular rhythm. Good peripheral circulation. Respiratory: Normal respiratory effort.  No retractions.  Gastrointestinal: Soft with mild epigastric discomfort.  No focal tenderness.  No distention.  Genitourinary: No flank tenderness. Musculoskeletal: Extremities warm and well perfused.  Neurologic:  Normal speech and language. No gross focal neurologic deficits are appreciated.  Skin:  Skin is warm and dry. No rash noted. Psychiatric: Mood and affect are normal. Speech and behavior are normal.  ____________________________________________   LABS (all labs ordered are listed, but only abnormal results are displayed)  Labs Reviewed  COMPREHENSIVE METABOLIC PANEL - Abnormal; Notable for the following components:      Result Value   Glucose, Bld 105 (*)    All other components within normal limits  URINALYSIS, COMPLETE (UACMP) WITH MICROSCOPIC - Abnormal; Notable for the following components:   Color, Urine STRAW (*)    APPearance CLEAR (*)    All other components within normal limits  LIPASE, BLOOD  CBC   ____________________________________________  EKG   ____________________________________________  RADIOLOGY    ____________________________________________   PROCEDURES  Procedure(s) performed: No  Procedures  Critical Care  performed: No ____________________________________________   INITIAL IMPRESSION / ASSESSMENT AND PLAN / ED COURSE  Pertinent labs & imaging results that were available during my care of the patient were reviewed by me and considered in my medical decision making (see chart for details).  29 year old male with PMH as noted above presents with nausea and vomiting over the last 3 days associated with mainly epigastric pain.  He believes it was precipitated by spicy food.  I reviewed the past medical records in [place].  The patient was seen in the ED with an episode of abdominal pain and vomiting in 2019 that was diagnosed as mild pancreatitis.  He self-reports a history of alcohol abuse in the past although has not had any alcohol in approximately 2 years.  He denies ever having a GI work-up or endoscopy in the past.  Differential includes gastritis, PUD, pancreatitis, other hepatobiliary cause, gastroenteritis, foodborne illness.  Given the lack of tenderness or distention I do not suspect SBO or other surgical  etiology.  We will obtain lab work-up, give fluids and symptomatic treatment, and reassess.  At this time, there is no indication for imaging.  ----------------------------------------- 12:15 PM on 01/28/2020 -----------------------------------------  The lab work-up is unremarkable.  The patient had some improvement with the initial Zofran and Pepcid, but continued to have crampy pain.  Now after Reglan and Bentyl, he states he feels much better.  He is tolerating p.o. and has not vomited while in the ED.  At this time, he is stable for discharge home.  I counseled him on the results of the work-up.  Return precautions given, and he expresses understanding. ____________________________________________   FINAL CLINICAL IMPRESSION(S) / ED DIAGNOSES  Final diagnoses:  Non-intractable vomiting with nausea, unspecified vomiting type      NEW MEDICATIONS STARTED DURING THIS VISIT:  New  Prescriptions   DICYCLOMINE (BENTYL) 10 MG CAPSULE    Take 1 capsule (10 mg total) by mouth 3 (three) times daily as needed for up to 3 days (abdominal pain/cramping).   METOCLOPRAMIDE (REGLAN) 10 MG TABLET    Take 1 tablet (10 mg total) by mouth every 8 (eight) hours as needed for up to 3 days for nausea or vomiting.     Note:  This document was prepared using Dragon voice recognition software and may include unintentional dictation errors.    Dionne Bucy, MD 01/28/20 1216

## 2020-01-28 NOTE — ED Notes (Signed)
This RN to bedside due to patient calling out, pt states pain in his side is returning. EDP notified at this time.

## 2020-01-28 NOTE — ED Notes (Signed)
Pt given sprite for PO challenge at this time.

## 2020-01-28 NOTE — ED Triage Notes (Signed)
FIRST NURSE NOTE:  Pt c/o vomiting for several days as well as abdominal pain.

## 2020-01-28 NOTE — ED Notes (Signed)
This RN to bedside, introduced self to patient, pt c/o increasing N/V, hx of pancreatitis. Pt states 4 episodes of vomiting in 24 hrs, 20 in 72 hrs. Pt states neck pain from vomiting, states abdominal burning with vomiting. IV therapy initiated per EDP order, medications initaited per EDP order. 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 Pt given remote, lights dimmed for comfort. Call bell remains within reach, pt instructed to use should any needs arise. Pt states understanding.

## 2020-01-28 NOTE — ED Notes (Signed)
EDP made aware of patient c/o pain and request for pain medication.

## 2020-01-28 NOTE — ED Notes (Signed)
Patient restless, anxious.  Asking for medication to help with anxiety stating "I'm going to go crazy in here, I've got restless leg syndrome".  Patient AAOx3.  Skin warm and dry.  Dr. Marisa Severin notified, benadryl ordered and administered per [facility].  Continue to monitor.

## 2020-02-06 ENCOUNTER — Other Ambulatory Visit: Payer: Self-pay

## 2020-02-06 ENCOUNTER — Emergency Department
Admission: EM | Admit: 2020-02-06 | Discharge: 2020-02-06 | Disposition: A | Discharge status: Home / Self Care | Payer: Self-pay | Attending: Emergency Medicine | Admitting: Emergency Medicine

## 2020-02-06 ENCOUNTER — Encounter: Payer: Self-pay | Admitting: *Deleted

## 2020-02-06 DIAGNOSIS — F1210 Cannabis abuse, uncomplicated: Secondary | ICD-10-CM | POA: Insufficient documentation

## 2020-02-06 DIAGNOSIS — R1010 Upper abdominal pain, unspecified: Secondary | ICD-10-CM | POA: Insufficient documentation

## 2020-02-06 DIAGNOSIS — Z79899 Other long term (current) drug therapy: Secondary | ICD-10-CM | POA: Insufficient documentation

## 2020-02-06 DIAGNOSIS — F17200 Nicotine dependence, unspecified, uncomplicated: Secondary | ICD-10-CM | POA: Insufficient documentation

## 2020-02-06 DIAGNOSIS — K2970 Gastritis, unspecified, without bleeding: Secondary | ICD-10-CM | POA: Insufficient documentation

## 2020-02-06 DIAGNOSIS — Z20822 Contact with and (suspected) exposure to covid-19: Secondary | ICD-10-CM | POA: Insufficient documentation

## 2020-02-06 LAB — URINALYSIS, COMPLETE (UACMP) WITH MICROSCOPIC
Bacteria, UA: NONE SEEN
Glucose, UA: NEGATIVE mg/dL
Hgb urine dipstick: NEGATIVE
Ketones, ur: 15 mg/dL — AB
Leukocytes,Ua: NEGATIVE
Nitrite: NEGATIVE
Protein, ur: NEGATIVE mg/dL
RBC / HPF: NONE SEEN RBC/hpf (ref 0–5)
Specific Gravity, Urine: >1.03 — ABNORMAL HIGH (ref 1.005–1.030)
pH: 5.5 (ref 5.0–8.0)

## 2020-02-06 LAB — CBC
HCT: 44.2 % (ref 39.0–52.0)
Hemoglobin: 15.3 g/dL (ref 13.0–17.0)
MCH: 31 pg (ref 26.0–34.0)
MCHC: 34.6 g/dL (ref 30.0–36.0)
MCV: 89.7 fL (ref 80.0–100.0)
Platelets: 230 10*3/uL (ref 150–400)
RBC: 4.93 MIL/uL (ref 4.22–5.81)
RDW: 12.4 % (ref 11.5–15.5)
WBC: 7.1 10*3/uL (ref 4.0–10.5)
nRBC: 0 % (ref 0.0–0.2)

## 2020-02-06 LAB — LIPASE, BLOOD: Lipase: 21 U/L (ref 11–51)

## 2020-02-06 LAB — COMPREHENSIVE METABOLIC PANEL
ALT: 15 U/L (ref 0–44)
AST: 15 U/L (ref 15–41)
Albumin: 4.9 g/dL (ref 3.5–5.0)
Alkaline Phosphatase: 58 U/L (ref 38–126)
Anion gap: 8 (ref 5–15)
BUN: 15 mg/dL (ref 6–20)
CO2: 27 mmol/L (ref 22–32)
Calcium: 9.7 mg/dL (ref 8.9–10.3)
Chloride: 105 mmol/L (ref 98–111)
Creatinine, Ser: 1.08 mg/dL (ref 0.61–1.24)
GFR calc Af Amer: >60 mL/min (ref >60)
GFR calc non Af Amer: >60 mL/min (ref >60)
Glucose, Bld: 106 mg/dL — ABNORMAL HIGH (ref 70–99)
Potassium: 3.8 mmol/L (ref 3.5–5.1)
Sodium: 140 mmol/L (ref 135–145)
Total Bilirubin: 0.8 mg/dL (ref 0.3–1.2)
Total Protein: 8.3 g/dL — ABNORMAL HIGH (ref 6.5–8.1)

## 2020-02-06 LAB — SARS CORONAVIRUS 2 (TAT 6-24 HRS): SARS Coronavirus 2: NEGATIVE

## 2020-02-06 MED ORDER — LIDOCAINE VISCOUS HCL 2 % MT SOLN
15.0000 mL | Freq: Once | OROMUCOSAL | Status: AC
  Administered 2020-02-06: 16:00:00 15 mL via ORAL
  Filled 2020-02-06: qty 15

## 2020-02-06 MED ORDER — ALUM & MAG HYDROXIDE-SIMETH 200-200-20 MG/5ML PO SUSP
30.0000 mL | Freq: Once | ORAL | Status: AC
  Administered 2020-02-06: 16:00:00 30 mL via ORAL
  Filled 2020-02-06: qty 30

## 2020-02-06 MED ORDER — HYOSCYAMINE SULFATE 0.125 MG SL SUBL
0.2500 mg | SUBLINGUAL_TABLET | Freq: Once | SUBLINGUAL | Status: AC
  Administered 2020-02-06: 16:00:00 0.25 mg via SUBLINGUAL
  Filled 2020-02-06: qty 2

## 2020-02-06 MED ORDER — PANTOPRAZOLE SODIUM 20 MG PO TBEC: 20.0000 mg | DELAYED_RELEASE_TABLET | Freq: Every day | ORAL | 1 refills | Status: DC

## 2020-02-06 MED ORDER — SODIUM CHLORIDE 0.9% FLUSH: 3.0000 mL | Freq: Once | INTRAVENOUS | Status: DC

## 2020-02-06 NOTE — ED Notes (Signed)
Patient reports having covid like symptoms, feeling sob, fatigues and diarrhea. Here to r/o out covid.

## 2020-02-06 NOTE — ED Triage Notes (Signed)
Pt reports nausea and vomiting.  Pt taking zofran odt without relief.  Pt was seen here recently with similar sx.  Pt also requesting covid testing.  Diarrhea x 3   Pt alert  Speech clear.

## 2020-02-06 NOTE — ED Provider Notes (Signed)
[facility] Emergency Department Provider Note  Time seen: 3:57 PM  I have reviewed the triage vital signs and the nursing notes.   HISTORY  Chief Complaint Nausea and Emesis   HPI Brian Gordon is a 29 y.o. male with a past medical history of gastric reflux presents to the emergency department for nausea vomiting upper abdominal discomfort.  According to the patient this has been an ongoing issue for many months if not longer.  Patient states he has been referred to a GI doctor but admits he has not followed up because he only recently obtained health insurance.  Patient states a history of alcoholism in the past but has been sober for quite some time.  Patient currently takes omeprazole but states he has not been taking it every day.  States it feels like reflux, he states it feels like he might have an ulcer.  Denies any fever.  No diarrhea.  Patient was seen here 4/24 for the same issue, states the medications helped significantly but he ran out several days ago.   Past Medical History:  Diagnosis Date  . Concussion   . GERD (gastroesophageal reflux disease)     There are no problems to display for this patient.   No past surgical history on file.  Prior to Admission medications   Medication Sig Start Date End Date Taking? Authorizing Provider  amoxicillin (AMOXIL) 500 MG tablet Take 1 tablet (500 mg total) by mouth 3 (three) times daily. 09/14/18   Triplett, Johnette Abraham B, FNP  calcium carbonate (TUMS - DOSED IN MG ELEMENTAL CALCIUM) 500 MG chewable tablet Chew 1 tablet by mouth daily as needed for indigestion or heartburn.    [provider]  cyclobenzaprine (FLEXERIL) 10 MG tablet Take 1 tablet (10 mg total) by mouth 3 (three) times daily as needed. 04/05/19   Sable Feil, PA-C  dicyclomine (BENTYL) 10 MG capsule Take 1 capsule (10 mg total) by mouth 3 (three) times daily as needed for up to 3 days (abdominal pain/cramping). 01/28/20 01/31/20  Arta Silence, MD  methylPREDNISolone (MEDROL DOSEPAK) 4 MG TBPK tablet Take Tapered dose as directed 04/05/19   Sable Feil, PA-C  metoCLOPramide (REGLAN) 10 MG tablet Take 1 tablet (10 mg total) by mouth every 8 (eight) hours as needed for up to 3 days for nausea or vomiting. 01/28/20 01/31/20  Arta Silence, MD  omeprazole (PRILOSEC) 40 MG capsule Take 1 capsule (40 mg total) by mouth daily. 04/17/18 04/17/19  Rudene Re, MD  ondansetron (ZOFRAN ODT) 4 MG disintegrating tablet Take 1 tablet (4 mg total) by mouth every 8 (eight) hours as needed for nausea or vomiting. 04/17/18   Alfred Levins, [place], MD  oxyCODONE-acetaminophen (PERCOCET) 5-325 MG tablet Take 1 tablet by mouth every 4 (four) hours as needed for severe pain. 04/17/18   Rudene Re, MD  promethazine (PHENERGAN) 12.5 MG tablet Take 1 tablet (12.5 mg total) by mouth every 6 (six) hours as needed for nausea or vomiting. Patient not taking: Reported on 04/17/2018 10/13/17   Merlyn Lot, MD  traMADol (ULTRAM) 50 MG tablet Take 1 tablet (50 mg total) by mouth every 6 (six) hours as needed. 09/14/18   Victorino Dike, FNP    Allergies  Allergen Reactions  . Cherry Anaphylaxis    No family history on file.  Social History Social History   Tobacco Use  . Smoking status: Current Every Day Smoker  . Smokeless tobacco: Never Used  Substance Use Topics  .  Alcohol use: Not Currently  . Drug use: Yes    Types: Marijuana    Comment: last used a few days ago    Review of Systems Constitutional: Negative for fever. Cardiovascular: Negative for chest pain. Respiratory: Negative for shortness of breath. Gastrointestinal: Mild upper abdominal burning.  Positive for nausea vomiting over the last 12 hours.  Negative for diarrhea Musculoskeletal: Negative for musculoskeletal complaints Skin: Negative for skin complaints  Neurological: Negative for headache All other ROS  negative  ____________________________________________   PHYSICAL EXAM:  VITAL SIGNS: ED Triage Vitals  Enc Vitals Group     BP 02/06/20 1516 121/85     Pulse Rate 02/06/20 1516 98     Resp 02/06/20 1516 18     Temp 02/06/20 1516 98 F (36.7 C)     Temp Source 02/06/20 1516 Oral     SpO2 02/06/20 1516 99 %     Weight 02/06/20 1515 175 lb (79.4 kg)     Height 02/06/20 1515 6\' 1"  (1.854 m)     Head Circumference --      Peak Flow --      Pain Score 02/06/20 1522 5     Pain Loc --      Pain Edu? --      Excl. in [place]? --     Constitutional: Alert and oriented. Well appearing and in no distress. Eyes: Normal exam ENT      Head: Normocephalic and atraumatic.      Mouth/Throat: Mucous membranes are moist. Cardiovascular: Normal rate, regular rhythm.  Respiratory: Normal respiratory effort without tachypnea nor retractions. Breath sounds are clear Gastrointestinal: Soft, mild epigastric tenderness palpation without rebound guarding or distention. Musculoskeletal: Nontender with normal range of motion in all extremities.  Neurologic:  Normal speech and language. No gross focal neurologic deficits  Skin:  Skin is warm, dry and intact.  Psychiatric: Mood and affect are normal.   ____________________________________________   INITIAL IMPRESSION / ASSESSMENT AND PLAN / ED COURSE  Pertinent labs & imaging results that were available during my care of the patient were reviewed by me and considered in my medical decision making (see chart for details).   Patient presents emergency department for nausea vomiting upper abdominal discomfort.  History of the same in the past.  Differential would include gastritis, pancreatitis, gastroenteritis, peptic ulcer disease, less likely biliary pathology.  We will check labs, treat with a GI cocktail and continue to closely monitor.  If work-up is otherwise reassuring I would anticipate likely discharge home with GI follow-up Protonix instead of  omeprazole, and Maalox as needed.  Lab work is reassuring.  Will discharge patient with Maalox, Protonix  04/07/20 Brian Gordon was evaluated in Emergency Department on 02/06/2020 for the symptoms described in the history of present illness. He was evaluated in the context of the global COVID-19 pandemic, which necessitated consideration that the patient might be at risk for infection with the SARS-CoV-2 virus that causes COVID-19. Institutional protocols and algorithms that pertain to the evaluation of patients at risk for COVID-19 are in a state of rapid change based on information released by regulatory bodies including the CDC and federal and state organizations. These policies and algorithms were followed during the patient's care in the ED.  ____________________________________________   FINAL CLINICAL IMPRESSION(S) / ED DIAGNOSES  04/07/2020   Caprice Beaver, MD 02/06/20 1650

## 2020-02-06 NOTE — ED Notes (Signed)
Pt to ED via POV c/o abd pain and nausea

## 2020-03-03 ENCOUNTER — Emergency Department
Admission: EM | Admit: 2020-03-03 | Discharge: 2020-03-03 | Disposition: A | Discharge status: Home / Self Care | Payer: Worker's Compensation | Attending: Emergency Medicine | Admitting: Emergency Medicine

## 2020-03-03 ENCOUNTER — Emergency Department: Payer: Self-pay

## 2020-03-03 ENCOUNTER — Encounter: Payer: Self-pay | Admitting: Emergency Medicine

## 2020-03-03 ENCOUNTER — Other Ambulatory Visit: Payer: Self-pay

## 2020-03-03 DIAGNOSIS — F17200 Nicotine dependence, unspecified, uncomplicated: Secondary | ICD-10-CM | POA: Insufficient documentation

## 2020-03-03 DIAGNOSIS — Z79899 Other long term (current) drug therapy: Secondary | ICD-10-CM | POA: Insufficient documentation

## 2020-03-03 DIAGNOSIS — W010XXA Fall on same level from slipping, tripping and stumbling without subsequent striking against object, initial encounter: Secondary | ICD-10-CM | POA: Insufficient documentation

## 2020-03-03 DIAGNOSIS — Y9289 Other specified places as the place of occurrence of the external cause: Secondary | ICD-10-CM | POA: Insufficient documentation

## 2020-03-03 DIAGNOSIS — Y990 Civilian activity done for income or pay: Secondary | ICD-10-CM | POA: Insufficient documentation

## 2020-03-03 DIAGNOSIS — Y9389 Activity, other specified: Secondary | ICD-10-CM | POA: Insufficient documentation

## 2020-03-03 DIAGNOSIS — M5441 Lumbago with sciatica, right side: Secondary | ICD-10-CM | POA: Insufficient documentation

## 2020-03-03 DIAGNOSIS — M546 Pain in thoracic spine: Secondary | ICD-10-CM | POA: Insufficient documentation

## 2020-03-03 MED ORDER — PREDNISONE 10 MG PO TABS: ORAL_TABLET | ORAL | 0 refills | Status: DC

## 2020-03-03 MED ORDER — CYCLOBENZAPRINE HCL 10 MG PO TABS
10.0000 mg | ORAL_TABLET | Freq: Once | ORAL | Status: AC
  Administered 2020-03-03: 10 mg via ORAL
  Filled 2020-03-03: qty 1

## 2020-03-03 MED ORDER — DEXAMETHASONE SODIUM PHOSPHATE 10 MG/ML IJ SOLN
10.0000 mg | Freq: Once | INTRAMUSCULAR | Status: AC
  Administered 2020-03-03: 10 mg via INTRAMUSCULAR
  Filled 2020-03-03: qty 1

## 2020-03-03 MED ORDER — CYCLOBENZAPRINE HCL 5 MG PO TABS: 5.0000 mg | ORAL_TABLET | Freq: Three times a day (TID) | ORAL | 0 refills | Status: DC | PRN

## 2020-03-03 NOTE — Discharge Instructions (Addendum)
You were seen today for acute right-sided thoracic and lumbar back pain.  Your x-rays were negative for any acute findings.  I am given you prescription for steroids and muscle relaxers.  Please avoid anti-inflammatories over-the-counter while you are taking steroids.  The muscle relaxer may cause sedation.  I have given you a work note.  Please follow-up with PCP/Ortho on June 2 as planned

## 2020-03-03 NOTE — ED Provider Notes (Signed)
[facility] Emergency Department Provider Note ____________________________________________  Time seen: 2130  I have reviewed the triage vital signs and the nursing notes.  HISTORY  Chief Complaint  Back Pain   HPI Brian Gordon is a 29 y.o. male presents to the ER today with complaint of acute on chronic right thoracic and right lower back pain.  He reports he has been having back pain for months which worsened today after a fall.  He reports he was pulling a pallet backwards and the pallet jack got caught causing him to land on his lower back and buttocks.  He describes the pain as sore, achy and pulling.  The pain radiates down his bilateral legs to his knees.  He reports associated numbness and tingling in the right lower extremity.  He has had ongoing numbness and tingling in the right lower extremity but reports it worsened after this recent fall.  He denies any issues with bowel bladder control.  He did not take any medications PTA.  He reports this is [employer]er, [employer]. for [employer].  Past Medical History:  Diagnosis Date  . Concussion   . GERD (gastroesophageal reflux disease)     There are no problems to display for this patient.   History reviewed. No pertinent surgical history.  Prior to Admission medications   Medication Sig Start Date End Date Taking? Authorizing Provider  amoxicillin (AMOXIL) 500 MG tablet Take 1 tablet (500 mg total) by mouth 3 (three) times daily. 09/14/18   Triplett, Rulon Eisenmenger B, FNP  calcium carbonate (TUMS - DOSED IN MG ELEMENTAL CALCIUM) 500 MG chewable tablet Chew 1 tablet by mouth daily as needed for indigestion or heartburn.    [provider]  cyclobenzaprine (FLEXERIL) 5 MG tablet Take 1 tablet (5 mg total) by mouth 3 (three) times daily as needed for muscle spasms. 03/03/20   Lorre Munroe, NP  dicyclomine (BENTYL) 10 MG capsule Take 1 capsule (10 mg total) by mouth 3 (three) times daily as needed for up to 3 days  (abdominal pain/cramping). 01/28/20 01/31/20  Dionne Bucy, MD  metoCLOPramide (REGLAN) 10 MG tablet Take 1 tablet (10 mg total) by mouth every 8 (eight) hours as needed for up to 3 days for nausea or vomiting. 01/28/20 01/31/20  Dionne Bucy, MD  omeprazole (PRILOSEC) 40 MG capsule Take 1 capsule (40 mg total) by mouth daily. 04/17/18 04/17/19  Nita Sickle, MD  ondansetron (ZOFRAN ODT) 4 MG disintegrating tablet Take 1 tablet (4 mg total) by mouth every 8 (eight) hours as needed for nausea or vomiting. 04/17/18   Don Perking, [place], MD  pantoprazole (PROTONIX) 20 MG tablet Take 1 tablet (20 mg total) by mouth daily. 02/06/20 02/05/21  Minna Antis, MD  predniSONE (DELTASONE) 10 MG tablet Take 3 tabs on days 1-3, take 2 tabs on days 4-6, take 1 tab on days 7-9 03/03/20   Lorre Munroe, NP  promethazine (PHENERGAN) 12.5 MG tablet Take 1 tablet (12.5 mg total) by mouth every 6 (six) hours as needed for nausea or vomiting. Patient not taking: Reported on 04/17/2018 10/13/17 03/03/20  Willy Eddy, MD    Allergies Valentino Saxon  No family history on file.  Social History Social History   Tobacco Use  . Smoking status: Current Every Day Smoker  . Smokeless tobacco: Never Used  Substance Use Topics  . Alcohol use: Not Currently  . Drug use: Not Currently    Types: Marijuana    Review of Systems  Constitutional: Negative for  fever, chills or body aches. Cardiovascular: Negative for chest pain or chest tightness. Respiratory: Negative for cough or shortness of breath. Gastrointestinal: Negative for loss of bowel control. Genitourinary: Negative for loss of bladder control. Musculoskeletal: Positive for right thoracic and lumbar back pain.  Negative for neck pain. Skin: Negative for bruising or abrasion. Neurological: Positive for numbness and tingling in his right lower extremity.  Negative for headaches, focal weakness or  dizziness. ____________________________________________  PHYSICAL EXAM:  VITAL SIGNS: ED Triage Vitals  Enc Vitals Group     BP 03/03/20 2053 (!) 118/106     Pulse Rate 03/03/20 2053 (!) 108     Resp 03/03/20 2053 18     Temp 03/03/20 2053 97.8 F (36.6 C)     Temp Source 03/03/20 2053 Oral     SpO2 03/03/20 2053 100 %     Weight 03/03/20 2054 175 lb (79.4 kg)     Height 03/03/20 2054 6\' 2"  (1.88 m)     Head Circumference --      Peak Flow --      Pain Score 03/03/20 2054 7     Pain Loc --      Pain Edu? --      Excl. in [place]? --     Constitutional: Alert and oriented.  Appears in pain, but in no distress. Head: Normocephalic and atraumatic. Cardiovascular: Tachycardic, regular rhythm.  Radial and pedal pulses 2+ bilaterally. Respiratory: Normal respiratory effort. No wheezes/rales/rhonchi. Musculoskeletal: Normal flexion, extension and rotation of the cervical spine.  No bony tenderness noted over the cervical spine.  Pain with flexion, extension and rotation of the thoracic lumbar spine.  Bony tenderness noted over lumbar spine and right parathoracic and paralumbar muscles.  Strength 5/5 BUE/BLE. Neurologic:  Normal gait without ataxia. Normal speech and language.  Positive SLR on the right. Skin:  Skin is warm, dry and intact. No abrasion or bruising noted.  ____________________________________________   RADIOLOGY   Imaging Orders     DG Thoracic Spine 2 View     DG Lumbar Spine 2-3 Views  IMPRESSION: Negative.  IMPRESSION: Mild broad-based rightward curvature of the lumbar spine, new from prior, may represent muscle spasm. No acute fracture.   ____________________________________________   INITIAL IMPRESSION / ASSESSMENT AND PLAN / ED COURSE  Acute Right Side Thoracic Back Pain, Acute Low Back Pain with Right Sided Sciatica:  Xray thoracic spine Xray lumbar spine Decadron 10 mg IM x 1 Flexeril 10 mg PO x 1 patient RX for Pred Taper x 9 days RX for Flexeril 10  mg TID prn ____________________________________________  FINAL CLINICAL IMPRESSION(S) / ED DIAGNOSES  Final diagnoses:  Acute right-sided thoracic back pain  Acute right-sided low back pain with right-sided sciatica      Jearld Fenton, NP 03/03/20 2214    Earleen Newport, MD 03/03/20 2246

## 2020-03-03 NOTE — ED Triage Notes (Signed)
Patient states that he was unloading a truck and a pallet got stuck and caused him to fall on his back. Patient with complaint of pain to lower back.

## 2020-03-07 ENCOUNTER — Encounter: Payer: Self-pay | Admitting: Family Medicine

## 2020-03-07 ENCOUNTER — Ambulatory Visit (INDEPENDENT_AMBULATORY_CARE_PROVIDER_SITE_OTHER): Payer: Self-pay | Admitting: Family Medicine

## 2020-03-07 ENCOUNTER — Other Ambulatory Visit: Payer: Self-pay

## 2020-03-07 VITALS — BP 100/62 | HR 97 | Resp 15 | Ht 73.0 in | Wt 159.0 lb

## 2020-03-07 DIAGNOSIS — M545 Low back pain, unspecified: Secondary | ICD-10-CM | POA: Insufficient documentation

## 2020-03-07 DIAGNOSIS — K219 Gastro-esophageal reflux disease without esophagitis: Secondary | ICD-10-CM

## 2020-03-07 DIAGNOSIS — M5441 Lumbago with sciatica, right side: Secondary | ICD-10-CM

## 2020-03-07 DIAGNOSIS — F331 Major depressive disorder, recurrent, moderate: Secondary | ICD-10-CM

## 2020-03-07 DIAGNOSIS — F411 Generalized anxiety disorder: Secondary | ICD-10-CM

## 2020-03-07 DIAGNOSIS — M62830 Muscle spasm of back: Secondary | ICD-10-CM

## 2020-03-07 DIAGNOSIS — Z7689 Persons encountering health services in other specified circumstances: Secondary | ICD-10-CM

## 2020-03-07 DIAGNOSIS — G8929 Other chronic pain: Secondary | ICD-10-CM

## 2020-03-07 MED ORDER — BACLOFEN 10 MG PO TABS: 5.0000 mg | ORAL_TABLET | Freq: Three times a day (TID) | ORAL | 1 refills | Status: DC | PRN

## 2020-03-07 MED ORDER — PANTOPRAZOLE SODIUM 20 MG PO TBEC: 20.0000 mg | DELAYED_RELEASE_TABLET | Freq: Every day | ORAL | 1 refills | Status: DC

## 2020-03-07 NOTE — Progress Notes (Signed)
Subjective:    Patient ID: Brian Gordon, male    DOB: 1991/09/18, 29 y.o.   MRN: [id number]  Brian Tullos is a 29 y.o. male presenting on 03/07/2020 for Establish Care, Back Pain (for 2 years.  However, fell at work 03/04/19 went to ER.  He was instructed to stay out of work till this appt. ), and Anxiety   HPI   GERD / Gastritis History of GERD. History of alcohol intake in past, he stopped completely 3-4 years ago. Admits spicy food makes worse. Seems acid reflux worse over past few years. Occasional Advil and ibuprofen NSAIDs, but not regularly since not helping his back. - Previously on Omeprazole 20mg  twice a day with some relief, but now was seen in ED on 02/06/20 had lab work up and switched to pantoprazole 20mg  daily with good results, needs refill.  Chronic Back Pain Chronic long term back pain, skateboard, parkour, gymnastics, prior pain and injuries Recent fall 3-4 days ago fall injury said pallet jack was stuck on truck, he fell backwards due to the force and landed on his butt, he describes mid low back pain radiating pain down into gluteal area bilateral and to upper back.  He currently works for 04/07/20 as a . He does a lot of heavy lifting daily at work. Out of work since 03/03/20 due to back injury from fall, he was advised by employer to go to hospital ED.  Reviewed hospital ED record. X-rays done, see below. No acute fracture, with notable spasm. Given Decadron IM x 1 and Flexeril then rx for Prednisone taper and Flexeril on discharge. New patient follow up with me today.  His work asked him to remain out of work until he was seen by me to determine next steps. He is unsure if he is ready to return to work or not.  He describes worsening back pain with pressure and worsening activity concern with sciatica. Seems pain is low back moderate to severe at times with episodic worsening with exertion lifting range of motion. Radiates from low back to mid and to lower,  bilateral. Has some radiating pain in R lower extremity sometimes both legs to knees, some tingling symptoms  History of mild curvature of back but never dx with Scoliosis  Additional topic today  Major Depression / Anxiety Chronic history of depression, anxiety No current management on rx medications. Has not been addressed by doctor in a while. Using Delta-8 regularly to help control his emotions. He used pot for while in past.   Health Maintenance: Moderna 1st vaccine COVID19 yesterday 03/06/20. Next scheduled. UTD TDap Has some history of vaccination records from previous, few years ago.  Depression screen PHQ 2/9 03/07/2020  Decreased Interest 2  Down, Depressed, Hopeless 2  PHQ - 2 Score 4  Altered sleeping 3  Tired, decreased energy 2  Change in appetite 2  Feeling bad or failure about yourself  1  Trouble concentrating 2  Moving slowly or fidgety/restless 1  Suicidal thoughts 0  PHQ-9 Score 15  Difficult doing work/chores Very difficult   GAD 7 : Generalized Anxiety Score 03/07/2020  Nervous, Anxious, on Edge 2  Control/stop worrying 3  Worry too much - different things 2  Trouble relaxing 3  Restless 2  Easily annoyed or irritable 2  Afraid - awful might happen 2  Total GAD 7 Score 16  Anxiety Difficulty Somewhat difficult     Past Medical History:  Diagnosis Date  . Allergy   .  Anxiety   . Concussion   . GERD (gastroesophageal reflux disease)    History reviewed. No pertinent surgical history. Social History   Socioeconomic History  . Marital status: Single    Spouse name: Not on file  . Number of children: Not on file  . Years of education: high school  . Highest education level: High school graduate  Occupational History  . Occupation: Editor, commissioning  Tobacco Use  . Smoking status: Current Every Day Smoker    Packs/day: 0.50    Years: 10.00    Pack years: 5.00    Types: Cigarettes  . Smokeless tobacco: Never Used  Substance and Sexual Activity   . Alcohol use: Not Currently    Comment: quit 3-4 years ago, approx 2018  . Drug use: Not Currently    Types: Marijuana    Comment: CBD OTC  . Sexual activity: Not on file  Other Topics Concern  . Not on file  Social History Narrative  . Not on file   Social Determinants of Health   Financial Resource Strain:   . Difficulty of Paying Living Expenses:   Food Insecurity:   . Worried About Programme researcher, broadcasting/film/video in the Last Year:   . Barista in the Last Year:   Transportation Needs:   . Freight forwarder (Medical):   Marland Kitchen Lack of Transportation (Non-Medical):   Physical Activity:   . Days of Exercise per Week:   . Minutes of Exercise per Session:   Stress:   . Feeling of Stress :   Social Connections:   . Frequency of Communication with Friends and Family:   . Frequency of Social Gatherings with Friends and Family:   . Attends Religious Services:   . Active Member of Clubs or Organizations:   . Attends Banker Meetings:   Marland Kitchen Marital Status:   Intimate Partner Violence:   . Fear of Current or Ex-Partner:   . Emotionally Abused:   Marland Kitchen Physically Abused:   . Sexually Abused:    Family History  Problem Relation Age of Onset  . Depression Mother   . Depression Sister   . Anxiety disorder Sister    Current Outpatient Medications on File Prior to Visit  Medication Sig  . predniSONE (DELTASONE) 10 MG tablet Take 3 tabs on days 1-3, take 2 tabs on days 4-6, take 1 tab on days 7-9  . calcium carbonate (TUMS - DOSED IN MG ELEMENTAL CALCIUM) 500 MG chewable tablet Chew 1 tablet by mouth daily as needed for indigestion or heartburn.  . cyclobenzaprine (FLEXERIL) 5 MG tablet Take 1 tablet (5 mg total) by mouth 3 (three) times daily as needed for muscle spasms.  . [DISCONTINUED] promethazine (PHENERGAN) 12.5 MG tablet Take 1 tablet (12.5 mg total) by mouth every 6 (six) hours as needed for nausea or vomiting. (Patient not taking: Reported on 04/17/2018)   No  current facility-administered medications on file prior to visit.    Review of Systems Per HPI unless specifically indicated above      Objective:    BP 100/62 (BP Location: Left Arm, Patient Position: Sitting, Cuff Size: Normal)   Pulse 97   Resp 15   Ht 6\' 1"  (1.854 m)   Wt 159 lb (72.1 kg)   SpO2 99%   BMI 20.98 kg/m   Wt Readings from Last 3 Encounters:  03/07/20 159 lb (72.1 kg)  03/03/20 175 lb (79.4 kg)  02/06/20 175 lb (79.4 kg)  Physical Exam Vitals and nursing note reviewed.  Constitutional:      General: He is not in acute distress.    Appearance: He is well-developed. He is not diaphoretic.     Comments: Well-appearing, comfortable, cooperative  HENT:     Head: Normocephalic and atraumatic.  Eyes:     General:        Right eye: No discharge.        Left eye: No discharge.     Conjunctiva/sclera: Conjunctivae normal.  Neck:     Thyroid: No thyromegaly.  Cardiovascular:     Rate and Rhythm: Normal rate and regular rhythm.     Heart sounds: Normal heart sounds. No murmur.  Pulmonary:     Effort: Pulmonary effort is normal. No respiratory distress.     Breath sounds: Normal breath sounds. No wheezing or rales.  Musculoskeletal:     Cervical back: Normal range of motion and neck supple.     Comments: Low Back Inspection: Normal appearance, thin body habitus, no spinal deformity, symmetrical.  Palpation: Mild tenderness over spinous processes lower lumbar. Has mid thoracic and lumbar paraspinal bilateral muscle hypertonicity and spasm.  ROM: Full active ROM forward flex / back extension, rotation L/R without discomfort  Special Testing: Seated SLR negative for radicular pain  bilaterally but Seated SLR with reproduced localized bilateral  Back pain.  Strength: Bilateral hip flex/ext 5/5, knee flex/ext 5/5, ankle dorsiflex/plantarflex 5/5 Neurovascular: intact distal sensation to light touch   Lymphadenopathy:     Cervical: No cervical adenopathy.    Skin:    General: Skin is warm and dry.     Findings: No erythema or rash.  Neurological:     Mental Status: He is alert and oriented to person, place, and time.  Psychiatric:        Behavior: Behavior normal.     Comments: Well groomed, good eye contact, normal speech and thoughts      I have personally reviewed the radiology report from 03/03/20 T and L Spine X-rays at ED.  DG Thoracic Spine 2 ViewPerformed 03/03/2020 Final result  Study Result CLINICAL DATA: 29 year old male with fall and back pain.  EXAM: THORACIC SPINE 2 VIEWS  COMPARISON: Chest radiograph dated 06/18/2019.  FINDINGS: There is no evidence of thoracic spine fracture. Alignment is normal. No other significant bone abnormalities are identified.  IMPRESSION: Negative.   Electronically Signed By: Elgie Collard M.D. On: 03/03/2020 22:03  ----------------------  DG Lumbar Spine 2-3 ViewsPerformed 03/03/2020 Final result  Study Result CLINICAL DATA: Fall on back. Pain to low back.  EXAM: LUMBAR SPINE - 2-3 VIEW  COMPARISON: Radiograph 04/05/2019  FINDINGS: Mild broad-based rightward curvature of the lumbar spine. This is new from prior. No listhesis. Vertebral body heights are preserved. No evidence of acute fracture. Disc spaces are maintained. Sacroiliac joints are congruent.  IMPRESSION: Mild broad-based rightward curvature of the lumbar spine, new from prior, may represent muscle spasm. No acute fracture.   Electronically Signed By: Narda Rutherford M.D. On: 03/03/2020 22:05    Results for orders placed or performed during the hospital encounter of 02/06/20  SARS CORONAVIRUS 2 (TAT 6-24 HRS) Nasopharyngeal Nasopharyngeal Swab   Specimen: Nasopharyngeal Swab  Result Value Ref Range   SARS Coronavirus 2 NEGATIVE NEGATIVE  Lipase, blood  Result Value Ref Range   Lipase 21 11 - 51 U/L  Comprehensive metabolic panel  Result Value Ref Range   Sodium 140 135 - 145 mmol/L    Potassium 3.8 3.5 -  5.1 mmol/L   Chloride 105 98 - 111 mmol/L   CO2 27 22 - 32 mmol/L   Glucose, Bld 106 (H) 70 - 99 mg/dL   BUN 15 6 - 20 mg/dL   Creatinine, Ser 1.08 0.61 - 1.24 mg/dL   Calcium 9.7 8.9 - 10.3 mg/dL   Total Protein 8.3 (H) 6.5 - 8.1 g/dL   Albumin 4.9 3.5 - 5.0 g/dL   AST 15 15 - 41 U/L   ALT 15 0 - 44 U/L   Alkaline Phosphatase 58 38 - 126 U/L   Total Bilirubin 0.8 0.3 - 1.2 mg/dL   GFR calc non Af Amer >60 >60 mL/min   GFR calc Af Amer >60 >60 mL/min   Anion gap 8 5 - 15  CBC  Result Value Ref Range   WBC 7.1 4.0 - 10.5 K/uL   RBC 4.93 4.22 - 5.81 MIL/uL   Hemoglobin 15.3 13.0 - 17.0 g/dL   HCT 44.2 39.0 - 52.0 %   MCV 89.7 80.0 - 100.0 fL   MCH 31.0 26.0 - 34.0 pg   MCHC 34.6 30.0 - 36.0 g/dL   RDW 12.4 11.5 - 15.5 %   Platelets 230 150 - 400 K/uL   nRBC 0.0 0.0 - 0.2 %  Urinalysis, Complete w Microscopic  Result Value Ref Range   Color, Urine YELLOW YELLOW   APPearance CLEAR CLEAR   Specific Gravity, Urine >1.030 (H) 1.005 - 1.030   pH 5.5 5.0 - 8.0   Glucose, UA NEGATIVE NEGATIVE mg/dL   Hgb urine dipstick NEGATIVE NEGATIVE   Bilirubin Urine SMALL (A) NEGATIVE   Ketones, ur 15 (A) NEGATIVE mg/dL   Protein, ur NEGATIVE NEGATIVE mg/dL   Nitrite NEGATIVE NEGATIVE   Leukocytes,Ua NEGATIVE NEGATIVE   Squamous Epithelial / LPF 0-5 0 - 5   WBC, UA 0-5 0 - 5 WBC/hpf   RBC / HPF NONE SEEN 0 - 5 RBC/hpf   Bacteria, UA NONE SEEN NONE SEEN   Mucus PRESENT       Assessment & Plan:   Problem List Items Addressed This Visit    Major depressive disorder, recurrent, moderate (HCC)   Gastroesophageal reflux disease without esophagitis   Relevant Medications   pantoprazole (PROTONIX) 20 MG tablet   GAD (generalized anxiety disorder)   Chronic bilateral low back pain without sciatica   Relevant Medications   baclofen (LIORESAL) 10 MG tablet    Other Visit Diagnoses    Acute bilateral low back pain with right-sided sciatica    -  Primary   Relevant  Medications   baclofen (LIORESAL) 10 MG tablet   Spasm of back muscles       Relevant Medications   baclofen (LIORESAL) 10 MG tablet   Encounter to establish care with new doctor          Review outside records, prior ED visits.  #Chronic Back Pain  Acute on chronic bilateral R>L LBP with associated R>L sciatica. Suspect likely due to muscle spasm/strain, with known fall injury on 5/29 at work. - he was sent to ED on 5/29 by work, he is unsure if this is covered by worker's comp or if not. - No prior back surgery but long history of back problems with chronic pain and wear and tear - Inadequate conservative therapy   Plan: 1. Avoid NSAIDs while on Prednisone and also caution with GERD - finish current Prednisone taper from ED 2. STOP Flexeril too sedating. Switch muscle relaxant to Baclofen  10mg  tabs - take 5-10mg  up to TID PRN, titrate up as tolerated 3. May use Tylenol PRN for breakthrough 4. Encouraged use of heating pad 1-2x daily for now then PRN 5. Follow-up 4-6 weeks if not improved for re-evaluation - consider sooner management if needed with Physical therapy vs Orthopedic referral - Advised that he should contact his employer HR first to discuss if this is worker's comp and if injury back pain is considered work related and how to proceed, if he needs referral from them and management, or if he is managing this through his own insurance and if it is not work related, then I can proceed with further med options and referral to PT, gave him a handwritten order for [facility] if he does proceed with me. - No note for work yet, he should contact me back by end of day will send to MyChart once we determine if I can take him out of work longer if needed or if it is worker's comp.   #Depression recurrent moderate / GAD Chronic problems for several years. Past history of self coping / treatment mechanisms with alcohol / substance marijuana, now off those substances on CBD. He is not  on medication rx for mood/anxiety He has had some difficulty lately with variety of stressors Not focus of visit today for management, will return to discuss in more detail and determine management options, once he is feeling back from perspective of back pain and other chronic problems. He is comfortable with this plan to resume discussion on his mental health in near future. He is open to medications and options available.  Meds ordered this encounter  Medications  . pantoprazole (PROTONIX) 20 MG tablet    Sig: Take 1 tablet (20 mg total) by mouth daily before breakfast.    Dispense:  90 tablet    Refill:  1  . baclofen (LIORESAL) 10 MG tablet    Sig: Take 0.5-1 tablets (5-10 mg total) by mouth 3 (three) times daily as needed for muscle spasms.    Dispense:  30 each    Refill:  1     Follow up plan: Return in about 4 weeks (around 04/04/2020) for Follow-up Anxiety/Depression PHQ GAD / Back pain.  Saralyn PilarAlexander Eron Staat, DO [facility] 03/07/2020, 10:23 AM

## 2020-03-07 NOTE — Patient Instructions (Addendum)
Thank you for coming to the office today.  Message on mychart once you have an answer from HR  Big questions - is this covered under worker's comp. And if you return tomorrow, would it still be covered, what about referral to Orthopedic or back specialist and further imaging or Physical Therapy.  1. For your Back Pain - I think that this is due to Muscle Spasms or strain. Your Sciatic Nerve can be affected causing some of your radiation and numbness down your legs.  2. Finish Prednisone current dose. - May take Ibuprofen or Aleve after finish but cautious to avoid stomach irritation 3. Stop Flexeril and Switch to Baclofen (Lioresal) 10mg  tablets - cut in half for 5mg  at night for muscle relaxant - may make you sedated or sleepy (be careful driving or working on this) if tolerated you can take every 8 hours, half or whole tab 4. May use Tylenol Extra Str 500mg  tabs - may take 1-2 tablets every 6 hours as needed 5. Recommend to start using heating pad on your lower back 1-2x daily for few weeks  This pain may take weeks to months to fully resolve, but hopefully it will respond to the medicine initially. All back injuries (small or serious) are slow to heal since we use our back muscles every day. Be careful with turning, twisting, lifting, sitting / standing for prolonged periods, and avoid re-injury.  If your symptoms significantly worsen with more pain, or new symptoms with weakness in one or both legs, new or different shooting leg pains, numbness in legs or groin, loss of control or retention of urine or bowel movements, please call back for advice and you may need to go directly to the Emergency Department.  Call HR check to see if this is a worker's comp case, if they are referring you to an Orthopedic back specialist or if you need me to refer. If it is NOT worker's comp then I can help more - we can refer you to Physical therapy as a next step. Otherwise if it is worker's comp they would need  to do referrals and further management to get it covered.  -------------------------------------------------------------------------  Start pantoprazole 20mg  daily before 1st meal of the day After 3 months, can taper down gradually to see how you do, every other day then every 3rd day, caution avoid rebound symptoms, if need it longer term can continue to take. Follow up as needed.  DIET RECOMMENDATIONS - Avoid spicy, greasy, fried foods, also things like caffeine, dark chocolate, peppermint can worsen - Avoid large meals and late night snacks, also do not go more than 4-5 hours without a snack or meal (not eating will worsen reflux symptoms due to stomach acid) - You may also elevate the head of your bed at night to sleep at very slight incline to help reduce symptoms  If the problem improves but keeps coming back, we can discuss higher dose or longer course at next visit.  If symptoms are worsening or persistent despite treatment or develop any different severe esophagus or abdominal pain, unable to swallow solids or liquids, nausea, vomiting especially blood in vomit, fever/chills, or unintentional weight loss / no appetite, please follow-up sooner in office or seek more immediate medical attention at hospital Emergency Department.  Regarding other medicines:  - STOP taking Ibuprofen, Advil, Motrin, Goody's / BC powder - DO NOT take without discussing with your doctor. These medicines can put you at high risk for future bleeding.  If  need pain medicine, may take Tylenol Extra Strength (Acetaminophen) 500mg  tabs - take 1 to 2 tabs per dose (max 1000mg ) every 6-8 hours for pain (take regularly, don't skip a dose for next 7 days), max 24 hour daily dose is 6 tablets or 3000mg . In the future you can repeat the same everyday Tylenol course for 1-2 weeks at a time.   Please schedule a Follow-up Appointment to: Return in about 4 weeks (around 04/04/2020) for Follow-up Anxiety/Depression PHQ GAD  / Back pain.  If you have any other questions or concerns, please feel free to call the office or send a message through [facility]. You may also schedule an earlier appointment if necessary.  Additionally, you may be receiving a survey about your experience at our office within a few days to 1 week by e-mail or mail. We value your feedback.  Nobie Putnam, DO [facility], [facility]             Low Back Pain Exercises  See other page with pictures of each exercise.  Start with 1 or 2 of these exercises that you are most comfortable with. Do not do any exercises that cause you significant worsening pain. Some of these may cause some "stretching soreness" but it should go away after you stop the exercise, and get better over time. Gradually increase up to 3-4 exercises as tolerated.  Standing hamstring stretch: Place the heel of your leg on a stool about 15 inches high. Keep your knee straight. Lean forward, bending at the hips until you feel a mild stretch in the back of your thigh. Make sure you do not roll your shoulders and bend at the waist when doing this or you will stretch your lower back instead. Hold the stretch for 15 to 30 seconds. Repeat 3 times. Repeat the same stretch on your other leg.  Cat and camel: Get down on your hands and knees. Let your stomach sag, allowing your back to curve downward. Hold this position for 5 seconds. Then arch your back and hold for 5 seconds. Do 3 sets of 10.  Quadriped Arm/Leg Raises: Get down on your hands and knees. Tighten your abdominal muscles to stiffen your spine. While keeping your abdominals tight, raise one arm and the opposite leg away from you. Hold this position for 5 seconds. Lower your arm and leg slowly and alternate sides. Do this 10 times on each side.  Pelvic tilt: Lie on your back with your knees bent and your feet flat on the floor. Tighten your abdominal muscles and push your lower back into the floor.  Hold this position for 5 seconds, then relax. Do 3 sets of 10.  Partial curl: Lie on your back with your knees bent and your feet flat on the floor. Tighten your stomach muscles and flatten your back against the floor. Tuck your chin to your chest. With your hands stretched out in front of you, curl your upper body forward until your shoulders clear the floor. Hold this position for 3 seconds. Don't hold your breath. It helps to breathe out as you lift your shoulders up. Relax. Repeat 10 times. Build to 3 sets of 10. To challenge yourself, clasp your hands behind your head and keep your elbows out to the side.  Lower trunk rotation: Lie on your back with your knees bent and your feet flat on the floor. Tighten your abdominal muscles and push your lower back into the floor. Keeping your shoulders down flat,  gently rotate your legs to one side, then the other as far as you can. Repeat 10 to 20 times.  Single knee to chest stretch: Lie on your back with your legs straight out in front of you. Bring one knee up to your chest and grasp the back of your thigh. Pull your knee toward your chest, stretching your buttock muscle. Hold this position for 15 to 30 seconds and return to the starting position. Repeat 3 times on each side.  Double knee to chest: Lie on your back with your knees bent and your feet flat on the floor. Tighten your abdominal muscles and push your lower back into the floor. Pull both knees up to your chest. Hold for 5 seconds and repeat 10 to 20 times.

## 2020-03-28 ENCOUNTER — Ambulatory Visit: Payer: Self-pay | Admitting: Family Medicine

## 2021-03-12 ENCOUNTER — Telehealth: Payer: Self-pay

## 2021-03-12 NOTE — Telephone Encounter (Signed)
Spoke to patient in regards to upcoming appointment with Dr Ashley Royalty.  Informed him that we are a sister clinic of [place] & that I see he has already established care with Dr Cheron Schaumann & has an upcoming appointment on June 17th for the same thing he scheduled himself through MyChart for Dr Ashley Royalty.  He stated he wasn't all that happy with his care but nothing of the sort was noted in the chart even from his last telephone encounter with them.  I informed the patient that we can see him however he will need to cancel his appointment with Dr Cheron Schaumann for June 17th if he is choosing to go with [place] & the pt stated he will do so after his visit with [place].  I informed the patient that he can go between both offices especially looking to get his ADHD meds refilled when we bothe have the same protocol of a psych eval & testing first.  Patient stated to cancel both of his appointments & that he will figure something out.

## 2021-03-14 ENCOUNTER — Ambulatory Visit: Payer: Self-pay | Admitting: Family Medicine

## 2021-03-22 ENCOUNTER — Ambulatory Visit: Payer: Self-pay | Admitting: Family Medicine

## 2021-04-23 ENCOUNTER — Emergency Department: Payer: Self-pay

## 2021-04-23 ENCOUNTER — Inpatient Hospital Stay
Admission: EM | Admit: 2021-04-23 | Discharge: 2021-04-25 | DRG: 869 | Disposition: A | Discharge status: Home / Self Care | Payer: Self-pay | Attending: Family Medicine | Admitting: Family Medicine

## 2021-04-23 ENCOUNTER — Other Ambulatory Visit: Payer: Self-pay

## 2021-04-23 DIAGNOSIS — Z818 Family history of other mental and behavioral disorders: Secondary | ICD-10-CM

## 2021-04-23 DIAGNOSIS — W57XXXA Bitten or stung by nonvenomous insect and other nonvenomous arthropods, initial encounter: Secondary | ICD-10-CM

## 2021-04-23 DIAGNOSIS — Z91018 Allergy to other foods: Secondary | ICD-10-CM

## 2021-04-23 DIAGNOSIS — K219 Gastro-esophageal reflux disease without esophagitis: Secondary | ICD-10-CM | POA: Diagnosis present

## 2021-04-23 DIAGNOSIS — F17210 Nicotine dependence, cigarettes, uncomplicated: Secondary | ICD-10-CM | POA: Diagnosis present

## 2021-04-23 DIAGNOSIS — R112 Nausea with vomiting, unspecified: Secondary | ICD-10-CM

## 2021-04-23 DIAGNOSIS — R519 Headache, unspecified: Secondary | ICD-10-CM

## 2021-04-23 DIAGNOSIS — S0006XA Insect bite (nonvenomous) of scalp, initial encounter: Secondary | ICD-10-CM

## 2021-04-23 DIAGNOSIS — R6883 Chills (without fever): Secondary | ICD-10-CM

## 2021-04-23 DIAGNOSIS — Z20822 Contact with and (suspected) exposure to covid-19: Secondary | ICD-10-CM | POA: Diagnosis present

## 2021-04-23 DIAGNOSIS — A770 Spotted fever due to Rickettsia rickettsii: Principal | ICD-10-CM | POA: Diagnosis present

## 2021-04-23 DIAGNOSIS — I214 Non-ST elevation (NSTEMI) myocardial infarction: Secondary | ICD-10-CM | POA: Diagnosis present

## 2021-04-23 DIAGNOSIS — D696 Thrombocytopenia, unspecified: Secondary | ICD-10-CM | POA: Diagnosis present

## 2021-04-23 DIAGNOSIS — I4510 Unspecified right bundle-branch block: Secondary | ICD-10-CM | POA: Diagnosis present

## 2021-04-23 LAB — COMPREHENSIVE METABOLIC PANEL
ALT: 15 U/L (ref 0–44)
AST: 19 U/L (ref 15–41)
Albumin: 4.3 g/dL (ref 3.5–5.0)
Alkaline Phosphatase: 75 U/L (ref 38–126)
Anion gap: 9 (ref 5–15)
BUN: 10 mg/dL (ref 6–20)
CO2: 25 mmol/L (ref 22–32)
Calcium: 9.3 mg/dL (ref 8.9–10.3)
Chloride: 104 mmol/L (ref 98–111)
Creatinine, Ser: 0.86 mg/dL (ref 0.61–1.24)
GFR, Estimated: >60 mL/min (ref >60)
Glucose, Bld: 105 mg/dL — ABNORMAL HIGH (ref 70–99)
Potassium: 3.7 mmol/L (ref 3.5–5.1)
Sodium: 138 mmol/L (ref 135–145)
Total Bilirubin: 0.7 mg/dL (ref 0.3–1.2)
Total Protein: 8 g/dL (ref 6.5–8.1)

## 2021-04-23 LAB — CBC WITH DIFFERENTIAL/PLATELET
Abs Immature Granulocytes: 0.01 10*3/uL (ref 0.00–0.07)
Basophils Absolute: 0 10*3/uL (ref 0.0–0.1)
Basophils Relative: 0 %
Eosinophils Absolute: 0 10*3/uL (ref 0.0–0.5)
Eosinophils Relative: 0 %
HCT: 46.4 % (ref 39.0–52.0)
Hemoglobin: 16.5 g/dL (ref 13.0–17.0)
Immature Granulocytes: 0 %
Lymphocytes Relative: 21 %
Lymphs Abs: 1.5 10*3/uL (ref 0.7–4.0)
MCH: 32 pg (ref 26.0–34.0)
MCHC: 35.6 g/dL (ref 30.0–36.0)
MCV: 89.9 fL (ref 80.0–100.0)
Monocytes Absolute: 1.1 10*3/uL — ABNORMAL HIGH (ref 0.1–1.0)
Monocytes Relative: 16 %
Neutro Abs: 4.6 10*3/uL (ref 1.7–7.7)
Neutrophils Relative %: 63 %
Platelets: 162 10*3/uL (ref 150–400)
RBC: 5.16 MIL/uL (ref 4.22–5.81)
RDW: 11.9 % (ref 11.5–15.5)
WBC: 7.2 10*3/uL (ref 4.0–10.5)
nRBC: 0.3 % — ABNORMAL HIGH (ref 0.0–0.2)

## 2021-04-23 LAB — RESP PANEL BY RT-PCR (FLU A&B, COVID) ARPGX2
Influenza A by PCR: NEGATIVE
Influenza B by PCR: NEGATIVE
SARS Coronavirus 2 by RT PCR: NEGATIVE

## 2021-04-23 LAB — PROCALCITONIN: Procalcitonin: <0.1 ng/mL

## 2021-04-23 LAB — MAGNESIUM: Magnesium: 2 mg/dL (ref 1.7–2.4)

## 2021-04-23 LAB — TROPONIN I (HIGH SENSITIVITY)
Troponin I (High Sensitivity): 158 ng/L (ref <18)
Troponin I (High Sensitivity): 165 ng/L (ref <18)

## 2021-04-23 MED ORDER — ACETAMINOPHEN 500 MG PO TABS
1000.0000 mg | ORAL_TABLET | Freq: Once | ORAL | Status: AC
  Administered 2021-04-23: 1000 mg via ORAL
  Filled 2021-04-23: qty 2

## 2021-04-23 MED ORDER — CYCLOBENZAPRINE HCL 10 MG PO TABS: 5.0000 mg | ORAL_TABLET | Freq: Three times a day (TID) | ORAL | Status: DC | PRN

## 2021-04-23 MED ORDER — ONDANSETRON HCL 4 MG/2ML IJ SOLN
4.0000 mg | Freq: Once | INTRAMUSCULAR | Status: AC
  Administered 2021-04-23: 4 mg via INTRAVENOUS
  Filled 2021-04-23: qty 2

## 2021-04-23 MED ORDER — ATORVASTATIN CALCIUM 80 MG PO TABS
80.0000 mg | ORAL_TABLET | Freq: Every day | ORAL | Status: DC
  Administered 2021-04-24 (×2): 80 mg via ORAL
  Filled 2021-04-23 (×3): qty 1

## 2021-04-23 MED ORDER — DOXYCYCLINE HYCLATE 100 MG PO TABS
100.0000 mg | ORAL_TABLET | Freq: Once | ORAL | Status: AC
  Administered 2021-04-23: 100 mg via ORAL
  Filled 2021-04-23: qty 1

## 2021-04-23 MED ORDER — ACETAMINOPHEN 325 MG PO TABS
650.0000 mg | ORAL_TABLET | ORAL | Status: DC | PRN
  Administered 2021-04-24 (×2): 650 mg via ORAL
  Filled 2021-04-23 (×3): qty 2

## 2021-04-23 MED ORDER — KETOROLAC TROMETHAMINE 30 MG/ML IJ SOLN
30.0000 mg | Freq: Once | INTRAMUSCULAR | Status: AC
  Administered 2021-04-23: 30 mg via INTRAVENOUS
  Filled 2021-04-23: qty 1

## 2021-04-23 MED ORDER — IOHEXOL 350 MG/ML SOLN
75.0000 mL | Freq: Once | INTRAVENOUS | Status: AC | PRN
  Administered 2021-04-23: 75 mL via INTRAVENOUS
  Filled 2021-04-23: qty 75

## 2021-04-23 MED ORDER — MAGNESIUM HYDROXIDE 400 MG/5ML PO SUSP: 30.0000 mL | Freq: Every day | ORAL | Status: DC | PRN

## 2021-04-23 MED ORDER — CALCIUM CARBONATE ANTACID 500 MG PO CHEW: 1.0000 | CHEWABLE_TABLET | Freq: Every day | ORAL | Status: DC | PRN

## 2021-04-23 MED ORDER — ALPRAZOLAM 0.25 MG PO TABS
0.2500 mg | ORAL_TABLET | Freq: Two times a day (BID) | ORAL | Status: DC | PRN
  Administered 2021-04-24 (×2): 0.25 mg via ORAL
  Filled 2021-04-23 (×2): qty 1

## 2021-04-23 MED ORDER — ASPIRIN 325 MG PO TABS
325.0000 mg | ORAL_TABLET | Freq: Once | ORAL | Status: AC
  Administered 2021-04-23: 325 mg via ORAL
  Filled 2021-04-23: qty 1

## 2021-04-23 MED ORDER — ENOXAPARIN SODIUM 40 MG/0.4ML IJ SOSY
40.0000 mg | PREFILLED_SYRINGE | INTRAMUSCULAR | Status: DC
  Administered 2021-04-24 (×2): 40 mg via SUBCUTANEOUS
  Filled 2021-04-23 (×2): qty 0.4

## 2021-04-23 MED ORDER — ALUM & MAG HYDROXIDE-SIMETH 200-200-20 MG/5ML PO SUSP
30.0000 mL | ORAL | Status: DC | PRN
  Administered 2021-04-24: 30 mL via ORAL
  Filled 2021-04-23: qty 30

## 2021-04-23 MED ORDER — ASPIRIN EC 325 MG PO TBEC
325.0000 mg | DELAYED_RELEASE_TABLET | Freq: Every day | ORAL | Status: DC
  Administered 2021-04-24 – 2021-04-25 (×2): 325 mg via ORAL
  Filled 2021-04-23 (×2): qty 1

## 2021-04-23 MED ORDER — ZOLPIDEM TARTRATE 5 MG PO TABS
5.0000 mg | ORAL_TABLET | Freq: Every evening | ORAL | Status: DC | PRN
  Administered 2021-04-25: 5 mg via ORAL
  Filled 2021-04-23: qty 1

## 2021-04-23 MED ORDER — NITROGLYCERIN 0.4 MG SL SUBL
0.4000 mg | SUBLINGUAL_TABLET | SUBLINGUAL | Status: DC | PRN
  Administered 2021-04-24: 0.4 mg via SUBLINGUAL
  Filled 2021-04-23: qty 1

## 2021-04-23 MED ORDER — TRAZODONE HCL 50 MG PO TABS
25.0000 mg | ORAL_TABLET | Freq: Every evening | ORAL | Status: DC | PRN
  Administered 2021-04-24: 25 mg via ORAL
  Filled 2021-04-23: qty 1

## 2021-04-23 MED ORDER — SODIUM CHLORIDE 0.9 % IV BOLUS
1000.0000 mL | Freq: Once | INTRAVENOUS | Status: AC
  Administered 2021-04-23: 1000 mL via INTRAVENOUS

## 2021-04-23 MED ORDER — BACLOFEN 10 MG PO TABS
5.0000 mg | ORAL_TABLET | Freq: Three times a day (TID) | ORAL | Status: DC | PRN
  Administered 2021-04-24: 10 mg via ORAL
  Filled 2021-04-23 (×3): qty 1

## 2021-04-23 MED ORDER — ONDANSETRON HCL 4 MG/2ML IJ SOLN
4.0000 mg | Freq: Four times a day (QID) | INTRAMUSCULAR | Status: DC | PRN
  Administered 2021-04-24 – 2021-04-25 (×3): 4 mg via INTRAVENOUS
  Filled 2021-04-23 (×3): qty 2

## 2021-04-23 MED ORDER — LACTATED RINGERS IV BOLUS: 1000.0000 mL | Freq: Once | INTRAVENOUS | Status: DC

## 2021-04-23 MED ORDER — SODIUM CHLORIDE 0.9 % IV SOLN: INTRAVENOUS | Status: DC

## 2021-04-23 NOTE — ED Triage Notes (Addendum)
Pt to ED for possible insect bite 2 days ago and has not been feeling well since. States tested neg for covid today.  Reports he has been having chills and bodyaches, afebrile on arrival.  Pt sweaty in triage NAD noted

## 2021-04-23 NOTE — ED Provider Notes (Signed)
[facility] Emergency Department Provider Note   ____________________________________________   Event Date/Time   First MD Initiated Contact with Patient 04/23/21 1819     (approximate)  I have reviewed the triage vital signs and the nursing notes.   HISTORY  Chief Complaint Insect Bite    HPI Brian Gordon is a 30 y.o. male with a stated past medical history of move anxiety/depression who presents for a spider bite to the posterior regular region as well as fever/chills, headache, and nausea/vomiting  LOCATION: Head DURATION: 2 days TIMING: Worsening since onset SEVERITY: Severe QUALITY: Aching pain CONTEXT: Patient states that he got a spider bite to the mastoid/posterior auricular region approximately 2 days prior to arrival and has had worsening headaches MODIFYING FACTORS: Denies any exacerbating or relieving factors ASSOCIATED SYMPTOMS: Nausea/vomiting, chills   Per medical record review, patient's history is noncontributory          Past Medical History:  Diagnosis Date   Allergy    Anxiety    Concussion    GERD (gastroesophageal reflux disease)     Patient Active Problem List   Diagnosis Date Noted   Chronic bilateral low back pain without sciatica 03/07/2020   Major depressive disorder, recurrent, moderate (HCC) 03/07/2020   GAD (generalized anxiety disorder) 03/07/2020   Gastroesophageal reflux disease without esophagitis 03/07/2020    No past surgical history on file.  Prior to Admission medications   Medication Sig Start Date End Date Taking? Authorizing Provider  baclofen (LIORESAL) 10 MG tablet Take 0.5-1 tablets (5-10 mg total) by mouth 3 (three) times daily as needed for muscle spasms. 03/07/20   Karamalegos, Netta Neat, DO  calcium carbonate (TUMS - DOSED IN MG ELEMENTAL CALCIUM) 500 MG chewable tablet Chew 1 tablet by mouth daily as needed for indigestion or heartburn.    [provider]  cyclobenzaprine  (FLEXERIL) 5 MG tablet Take 1 tablet (5 mg total) by mouth 3 (three) times daily as needed for muscle spasms. 03/03/20   Lorre Munroe, NP  pantoprazole (PROTONIX) 20 MG tablet Take 1 tablet (20 mg total) by mouth daily before breakfast. 03/07/20   Karamalegos, Netta Neat, DO  predniSONE (DELTASONE) 10 MG tablet Take 3 tabs on days 1-3, take 2 tabs on days 4-6, take 1 tab on days 7-9 03/03/20   Lorre Munroe, NP  promethazine (PHENERGAN) 12.5 MG tablet Take 1 tablet (12.5 mg total) by mouth every 6 (six) hours as needed for nausea or vomiting. Patient not taking: Reported on 04/17/2018 10/13/17 03/03/20  Willy Eddy, MD    Allergies Valentino Saxon  Family History  Problem Relation Age of Onset   Depression Mother    Depression Sister    Anxiety disorder Sister     Social History Social History   Tobacco Use   Smoking status: Every Day    Packs/day: 0.50    Years: 10.00    Pack years: 5.00    Types: Cigarettes   Smokeless tobacco: Never  Vaping Use   Vaping Use: Never used  Substance Use Topics   Alcohol use: Not Currently    Comment: quit 3-4 years ago, approx 2018   Drug use: Not Currently    Types: Marijuana    Comment: CBD OTC    Review of Systems Constitutional: Endorses chills. No fever Eyes: No visual changes. ENT: No sore throat. Cardiovascular: Denies chest pain. Respiratory: Denies shortness of breath. Gastrointestinal: No abdominal pain.  Endorses nausea and vomiting.  No diarrhea. Genitourinary:  Negative for dysuria. Musculoskeletal: Negative for acute arthralgias Skin: Negative for rash. Neurological: Positive for headaches, negative for weakness/numbness/paresthesias in any extremity Psychiatric: Negative for suicidal ideation/homicidal ideation   ____________________________________________   PHYSICAL EXAM:  VITAL SIGNS: ED Triage Vitals  Enc Vitals Group     BP 04/23/21 1817 109/85     Pulse Rate 04/23/21 1818 (!) 110     Resp 04/23/21 1817 20      Temp 04/23/21 1817 98.2 F (36.8 C)     Temp Source 04/23/21 1817 Oral     SpO2 04/23/21 1817 100 %     Weight 04/23/21 1818 160 lb (72.6 kg)     Height 04/23/21 1818 6\' 1"  (1.854 m)     Head Circumference --      Peak Flow --      Pain Score 04/23/21 1817 3     Pain Loc --      Pain Edu? --      Excl. in GC? --    Constitutional: Alert and oriented. Well appearing and in no acute distress. Eyes: Conjunctivae are normal. PERRL. Head: Atraumatic. Nose: No congestion/rhinnorhea. Mouth/Throat: Mucous membranes are moist. Neck: No stridor Cardiovascular: Grossly normal heart sounds.  Good peripheral circulation. Respiratory: Normal respiratory effort.  No retractions. Gastrointestinal: Soft and nontender. No distention. Musculoskeletal: No obvious deformities Neurologic:  Normal speech and language. No gross focal neurologic deficits are appreciated. Skin:  Skin is warm and dry. No rash noted.  Small subcentimeter area of erythema with a central punctate purulent area to the left mastoid Psychiatric: Mood and affect are anxious. Speech and behavior are normal.  ____________________________________________   LABS (all labs ordered are listed, but only abnormal results are displayed)  Labs Reviewed  COMPREHENSIVE METABOLIC PANEL  CBC WITH DIFFERENTIAL/PLATELET  LYME DISEASE SEROLOGY W/REFLEX  ROCKY MTN SPOTTED FVR ABS PNL(IGG+IGM)  TROPONIN I (HIGH SENSITIVITY)   PROCEDURES  Procedure(s) performed (including Critical Care):  Procedures   ____________________________________________   INITIAL IMPRESSION / ASSESSMENT AND PLAN / ED COURSE  As part of my medical decision making, I reviewed the following data within the electronic medical record, if available:  Nursing notes reviewed and incorporated, Labs reviewed, EKG interpreted, Old chart reviewed, Radiograph reviewed and Notes from prior ED visits reviewed and incorporated        Patient is a 30 year old male with  the above-stated past medical history who presents for a constellation of symptoms including a bug bite to the left mastoid region, headaches, nausea/vomiting, and chills.  Differential diagnosis includes but is not limited to: Tickborne illness, COVID infection, influenza infection, cellulitis, mastoiditis, meningitis Work-up: Pending  Care of this patient will be signed out to the oncoming physician at the end of my shift.  All pertinent patient information conveyed and all questions answered.  All further care and disposition decisions will be made by the oncoming physician.      ____________________________________________   FINAL CLINICAL IMPRESSION(S) / ED DIAGNOSES  Final diagnoses:  None     ED Discharge Orders     None        Note:  This document was prepared using Dragon voice recognition software and may include unintentional dictation errors.    26, MD 04/23/21 04/25/21

## 2021-04-23 NOTE — ED Provider Notes (Signed)
  Physical Exam  BP 109/85 (BP Location: Left Arm)   Pulse (!) 110   Temp 98.2 F (36.8 C) (Oral)   Resp 20   Ht 6\' 1"  (1.854 m)   Wt 72.6 kg   SpO2 100%   BMI 21.11 kg/m   Physical Exam  ED Course/Procedures      MDM  Patient received in handoff from attending Dr. at change of shift.  In short, patient has had 2 days of fever, chills, headache, nausea and vomiting after a spider/insect bite was noticed behind his left ear.  See original note for further HPI details.  Patient is awaiting laboratory evaluation, with plans for treatment with doxycycline for coverage for Lyme/[place] spotted fever if his flu and COVID is negative.  Initial respiratory panel noted to be negative.  EKG with sinus tachycardia with a rate of 101.  Normal QTC.  Normal axis.  There is an incomplete right bundle branch block that is new when compared to prior EKG in 2020.  Lab called with a critical value of a troponin of 158.  Given this, discussed with additional attending present in the department Dr. 2021, who recommended adding magnesium, procalcitonin, treatment with 325 mg aspirin.  Patient is endorsing shortness of breath that he favored to be related to anxiety, however given this constellation of symptoms and labs, will obtain CT angio to rule out PE.  This was negative for acute PE, there were some incidental nodes in the bilateral lung bases favored to be benign.  At this time, will discuss with the hospitalist for admission for NSTEMI.  Given the symptoms there may be a component of myocarditis/endocarditis or other viral etiology involved.  Dr. Antoine Primas did discuss the patient with on-call cardiology who will consult on the patient in the hospital.  Case was discussed with hospitalist Dr. Katrinka Blazing who agrees to admit the patient.  Patient is stable this time for transfer to the floor.       Mertie Moores, PA 04/23/21 2244    04/25/21, MD 04/30/21 [phone]

## 2021-04-23 NOTE — ED Notes (Signed)
See triage note  Presents with possible insect bite   States ha d been feverish and had the chills  States sxs' started 2 days ago  Afebrile on arrival

## 2021-04-23 NOTE — ED Notes (Signed)
Critical result: Trop 158 Aundria Rud, [place] aware

## 2021-04-23 NOTE — H&P (Signed)
[facility]   PATIENT NAME: Brian Gordon    MR#:  [id number]  DATE OF BIRTH:  1991-07-03  DATE OF ADMISSION:  04/23/2021  PRIMARY CARE PHYSICIAN: Smitty Cords, DO   Patient is coming from: Home  REQUESTING/REFERRING PHYSICIAN: Lucy Chris, PA   CHIEF COMPLAINT:   Chief Complaint  Patient presents with   Insect Bite    HISTORY OF PRESENT ILLNESS:  Brian Vasek is a 30 y.o. Caucasian male with medical history significant for GERD, depression and anxiety as well as ADHD, who presented to the emergency room with acute onset of insect bite suspected to be a spider bite to the left posterior auricular area as well as fever and chills, diaphoresis and body aches with nausea and vomiting last night and generalized body aches.  The patient experienced dyspnea that he thought was related to an anxiety.  He had mild right-sided chest pain and palpitations.  He admitted to lack of appetite.  She denies any diarrhea or abdominal pain.  No melena or bright red bleeding per rectum.  No dysuria, oliguria or hematuria or flank pain.  No other myalgia or arthralgia or rashes.  No paresthesias or focal muscle numbness.  ED Course: Upon presentation to the emergency room heart rate was 110 with otherwise normal vital signs.  Labs revealed unremarkable CMP.  High-sensitivity troponin I was 158 and later 165.  Procalcitonin was less than 0.1.  CBC was within normal.  Influenza antigens and COVID-19 PCR came back negative. EKG as reviewed by me : Showed sinus tachycardia with rate of 101 with incomplete right bundle blanch block. Imaging: Chest CTA revealed: -1. No evidence of pulmonary embolus. 2. Bilateral sub 4 mm pulmonary nodules, almost certainly benign in a patient of this age. No follow-up needed if patient is low-risk (and has no known or suspected primary neoplasm). Non-contrast chest CT can be considered in 12 months if patient is high-risk.  The patient was given 1 g  of p.o. Tylenol, 325 mg p.o. aspirin, 30mg  IV Toradol, 4 mg IV Zofran and 1 L bolus of IV normal saline.  He will be admitted to a progressive unit bed for further evaluation and management. PAST MEDICAL HISTORY:   Past Medical History:  Diagnosis Date   Allergy    Anxiety    Concussion    GERD (gastroesophageal reflux disease)     PAST SURGICAL HISTORY:  No past surgical history on file.  He denies any previous surgeries.  SOCIAL HISTORY:   Social History   Tobacco Use   Smoking status: Every Day    Packs/day: 0.50    Years: 10.00    Pack years: 5.00    Types: Cigarettes   Smokeless tobacco: Never  Substance Use Topics   Alcohol use: Not Currently    Comment: quit 3-4 years ago, approx 2018    FAMILY HISTORY:   Family History  Problem Relation Age of Onset   Depression Mother    Depression Sister    Anxiety disorder Sister     DRUG ALLERGIES:   Allergies  Allergen Reactions   Cherry Anaphylaxis    REVIEW OF SYSTEMS:   ROS As per history of present illness. All pertinent systems were reviewed above. Constitutional, HEENT, cardiovascular, respiratory, GI, GU, musculoskeletal, neuro, psychiatric, endocrine, integumentary and hematologic systems were reviewed and are otherwise negative/unremarkable except for positive findings mentioned above in the HPI.   MEDICATIONS AT HOME:   Prior to Admission medications  Medication Sig Start Date End Date Taking? Authorizing Provider  baclofen (LIORESAL) 10 MG tablet Take 0.5-1 tablets (5-10 mg total) by mouth 3 (three) times daily as needed for muscle spasms. 03/07/20   Karamalegos, Netta Neat, DO  calcium carbonate (TUMS - DOSED IN MG ELEMENTAL CALCIUM) 500 MG chewable tablet Chew 1 tablet by mouth daily as needed for indigestion or heartburn.    [provider]  cyclobenzaprine (FLEXERIL) 5 MG tablet Take 1 tablet (5 mg total) by mouth 3 (three) times daily as needed for muscle spasms. 03/03/20   Lorre Munroe, NP  pantoprazole (PROTONIX) 20 MG tablet Take 1 tablet (20 mg total) by mouth daily before breakfast. Patient not taking: Reported on 04/23/2021 03/07/20   Smitty Cords, DO  predniSONE (DELTASONE) 10 MG tablet Take 3 tabs on days 1-3, take 2 tabs on days 4-6, take 1 tab on days 7-9 03/03/20   Lorre Munroe, NP  promethazine (PHENERGAN) 12.5 MG tablet Take 1 tablet (12.5 mg total) by mouth every 6 (six) hours as needed for nausea or vomiting. Patient not taking: Reported on 04/17/2018 10/13/17 03/03/20  Willy Eddy, MD      VITAL SIGNS:  Blood pressure 109/85, pulse (!) 110, temperature 98.2 F (36.8 C), temperature source Oral, resp. rate 20, height 6\' 1"  (1.854 m), weight 72.6 kg, SpO2 100 %.  PHYSICAL EXAMINATION:  Physical Exam  GENERAL:  30 y.o.-year-old Caucasian male patient lying in the bed with no acute distress.  EYES: Pupils equal, round, reactive to light and accommodation. No scleral icterus. Extraocular muscles intact.  HEENT: Head atraumatic, normocephalic. Oropharynx and nasopharynx clear.  NECK:  Supple, no jugular venous distention. No thyroid enlargement, no tenderness.  LUNGS: Normal breath sounds bilaterally, no wheezing, rales,rhonchi or crepitation. No use of accessory muscles of respiration.  CARDIOVASCULAR: Regular rate and rhythm, S1, S2 normal. No murmurs, rubs, or gallops.  ABDOMEN: Soft, nondistended, nontender. Bowel sounds present. No organomegaly or mass.  EXTREMITIES: No pedal edema, cyanosis, or clubbing.  NEUROLOGIC: Cranial nerves II through XII are intact. Muscle strength 5/5 in all extremities. Sensation intact. Gait not checked.  PSYCHIATRIC: The patient is alert and oriented x 3.  Normal affect and good eye contact. SKIN: Puncture wound in the left postauricular scalp with no other rashes, lesion, or ulcer.   LABORATORY PANEL:   CBC Recent Labs  Lab 04/23/21 1937  WBC 7.2  HGB 16.5  HCT 46.4  PLT 162    ------------------------------------------------------------------------------------------------------------------  Chemistries  Recent Labs  Lab 04/23/21 1937  NA 138  K 3.7  CL 104  CO2 25  GLUCOSE 105*  BUN 10  CREATININE 0.86  CALCIUM 9.3  MG 2.0  AST 19  ALT 15  ALKPHOS 75  BILITOT 0.7   ------------------------------------------------------------------------------------------------------------------  Cardiac Enzymes No results for input(s): TROPONINI in the last 168 hours. ------------------------------------------------------------------------------------------------------------------  RADIOLOGY:  CT Angio Chest PE W and/or Wo Contrast  Result Date: 04/23/2021 CLINICAL DATA:  Spider bite, fever and chills, headache, nausea and vomiting EXAM: CT ANGIOGRAPHY CHEST WITH CONTRAST TECHNIQUE: Multidetector CT imaging of the chest was performed using the standard protocol during bolus administration of intravenous contrast. Multiplanar CT image reconstructions and MIPs were obtained to evaluate the vascular anatomy. CONTRAST:  64mL OMNIPAQUE IOHEXOL 350 MG/ML SOLN COMPARISON:  None. FINDINGS: Cardiovascular: This is a technically adequate evaluation of the pulmonary vasculature. No filling defects or pulmonary emboli. The heart is unremarkable without pericardial effusion. No evidence of thoracic aortic aneurysm  or dissection. Mediastinum/Nodes: No enlarged mediastinal, hilar, or axillary lymph nodes. Thyroid gland, trachea, and esophagus demonstrate no significant findings. Lungs/Pleura: No acute airspace disease, effusion, or pneumothorax. There is a 4 mm right middle lobe pulmonary nodule identified, reference image 67/6. 3 mm subpleural left upper lobe pulmonary nodules identified on image 66/6. Central airways are patent. Upper Abdomen: No acute abnormality. Musculoskeletal: No acute or destructive bony lesions. Reconstructed images demonstrate no additional findings. Review of  the MIP images confirms the above findings. IMPRESSION: 1. No evidence of pulmonary embolus. 2. Bilateral sub 4 mm pulmonary nodules, almost certainly benign in a patient of this age. No follow-up needed if patient is low-risk (and has no known or suspected primary neoplasm). Non-contrast chest CT can be considered in 12 months if patient is high-risk. This recommendation follows the consensus statement: Guidelines for Management of Incidental Pulmonary Nodules Detected on CT Images: From the Fleischner Society 2017; Radiology 2017; 284:228-243. Electronically Signed   By: Sharlet Salina M.D.   On: 04/23/2021 21:39      IMPRESSION AND PLAN:  Active Problems:   NSTEMI (non-ST elevated myocardial infarction) (HCC)  1.  Elevated troponin I concerning for viral myocarditis with differential diagnosis including non-ST elevation myocardial infarction.  The patient had subjective fever and chills as well as a non-intractable nausea and vomiting. - Patient will be admitted to a progressive unit bed. - We will follow serial troponin I's. - 2D echo and cardiology consult to be obtained. -Dr. Gwen Pounds was notified about the patient. - As needed IV morphine sulfate and SL NTG will be provided as needed.  2.  Recent insect bite with differential diagnosis including spider and tick bites. - The patient had [facility] spotted fever and Lyme titers sent. - We will place on IV doxycycline while he is here. - Blood cultures will be followed.  3.  GERD. - We will continue PPI therapy.  4.  History of major depressive disorder, generalized anxiety disorder and ADHD. - He is not apparently being treated at this time and was planning to see a psychiatrist. - We will place a consult for psychiatry for assessment while he is here. - Consult was placed to Dr. Toni Amend.  DVT prophylaxis: Lovenox. Code Status: full code. Family Communication:  The plan of care was discussed in details with the patient (and  family). I answered all questions. The patient agreed to proceed with the above mentioned plan. Further management will depend upon hospital course. Disposition Plan: Back to previous home environment Consults called: none.   All the records are reviewed and case discussed with ED provider.  Status is: Inpatient  Remains inpatient appropriate because:Ongoing active pain requiring inpatient pain management, Ongoing diagnostic testing needed not appropriate for outpatient work up, Unsafe d/c plan, IV treatments appropriate due to intensity of illness or inability to take PO, and Inpatient level of care appropriate due to severity of illness  Dispo: The patient is from: Home              Anticipated d/c is to: Home              Patient currently is not medically stable to d/c.   Difficult to place patient No  TOTAL TIME TAKING CARE OF THIS PATIENT: 55 minutes.    Hannah Beat M.D on 04/23/2021 at 10:45 PM  [facility]   From 7 PM-7 AM, contact night-coverage [email or web address]  CC: Primary care physician; Smitty Cords, DO

## 2021-04-23 NOTE — ED Notes (Signed)
Patient to CT at this time

## 2021-04-24 ENCOUNTER — Encounter: Payer: Self-pay | Admitting: Family Medicine

## 2021-04-24 ENCOUNTER — Inpatient Hospital Stay (HOSPITAL_COMMUNITY)
Admit: 2021-04-24 | Discharge: 2021-04-24 | Disposition: A | Discharge status: Home / Self Care | Payer: Self-pay | Attending: Family Medicine | Admitting: Family Medicine

## 2021-04-24 DIAGNOSIS — B349 Viral infection, unspecified: Secondary | ICD-10-CM

## 2021-04-24 DIAGNOSIS — R778 Other specified abnormalities of plasma proteins: Secondary | ICD-10-CM

## 2021-04-24 DIAGNOSIS — I214 Non-ST elevation (NSTEMI) myocardial infarction: Secondary | ICD-10-CM

## 2021-04-24 LAB — SEDIMENTATION RATE: Sed Rate: 7 mm/hr (ref 0–15)

## 2021-04-24 LAB — BASIC METABOLIC PANEL
Anion gap: 11 (ref 5–15)
BUN: 12 mg/dL (ref 6–20)
CO2: 22 mmol/L (ref 22–32)
Calcium: 8.6 mg/dL — ABNORMAL LOW (ref 8.9–10.3)
Chloride: 102 mmol/L (ref 98–111)
Creatinine, Ser: 0.77 mg/dL (ref 0.61–1.24)
GFR, Estimated: >60 mL/min (ref >60)
Glucose, Bld: 110 mg/dL — ABNORMAL HIGH (ref 70–99)
Potassium: 4 mmol/L (ref 3.5–5.1)
Sodium: 135 mmol/L (ref 135–145)

## 2021-04-24 LAB — LIPID PANEL
Cholesterol: 116 mg/dL (ref 0–200)
HDL: 27 mg/dL — ABNORMAL LOW (ref >40)
LDL Cholesterol: 66 mg/dL (ref 0–99)
Total CHOL/HDL Ratio: 4.3 RATIO
Triglycerides: 116 mg/dL (ref <150)
VLDL: 23 mg/dL (ref 0–40)

## 2021-04-24 LAB — HIV ANTIBODY (ROUTINE TESTING W REFLEX): HIV Screen 4th Generation wRfx: NONREACTIVE

## 2021-04-24 LAB — URINE DRUG SCREEN, QUALITATIVE (ARMC ONLY)
Amphetamines, Ur Screen: NOT DETECTED
Barbiturates, Ur Screen: NOT DETECTED
Benzodiazepine, Ur Scrn: POSITIVE — AB
Cannabinoid 50 Ng, Ur ~~LOC~~: POSITIVE — AB
Cocaine Metabolite,Ur ~~LOC~~: NOT DETECTED
MDMA (Ecstasy)Ur Screen: NOT DETECTED
Methadone Scn, Ur: NOT DETECTED
Opiate, Ur Screen: NOT DETECTED
Phencyclidine (PCP) Ur S: NOT DETECTED
Tricyclic, Ur Screen: NOT DETECTED

## 2021-04-24 LAB — PROCALCITONIN: Procalcitonin: <0.1 ng/mL

## 2021-04-24 LAB — CBC
HCT: 39.4 % (ref 39.0–52.0)
Hemoglobin: 14 g/dL (ref 13.0–17.0)
MCH: 32.3 pg (ref 26.0–34.0)
MCHC: 35.5 g/dL (ref 30.0–36.0)
MCV: 91 fL (ref 80.0–100.0)
Platelets: 148 10*3/uL — ABNORMAL LOW (ref 150–400)
RBC: 4.33 MIL/uL (ref 4.22–5.81)
RDW: 12.1 % (ref 11.5–15.5)
WBC: 7.3 10*3/uL (ref 4.0–10.5)
nRBC: 0 % (ref 0.0–0.2)

## 2021-04-24 LAB — ECHOCARDIOGRAM COMPLETE
AR max vel: 3.48 cm2
AV Area VTI: 4.31 cm2
AV Area mean vel: 3.87 cm2
AV Mean grad: 2 mmHg
AV Peak grad: 3.9 mmHg
Ao pk vel: 0.99 m/s
Area-P 1/2: 6.32 cm2
Height: 73 in
MV VTI: 4.45 cm2
S' Lateral: 3 cm
Weight: 2560 oz

## 2021-04-24 LAB — CK: Total CK: 68 U/L (ref 49–397)

## 2021-04-24 LAB — RESPIRATORY PANEL BY PCR

## 2021-04-24 LAB — TROPONIN I (HIGH SENSITIVITY): Troponin I (High Sensitivity): 204 ng/L (ref <18)

## 2021-04-24 MED ORDER — GUAIFENESIN-DM 100-10 MG/5ML PO SYRP
5.0000 mL | ORAL_SOLUTION | ORAL | Status: DC | PRN
  Administered 2021-04-24: 5 mL via ORAL
  Filled 2021-04-24: qty 5

## 2021-04-24 MED ORDER — TRAMADOL HCL 50 MG PO TABS
50.0000 mg | ORAL_TABLET | Freq: Four times a day (QID) | ORAL | Status: DC | PRN
  Administered 2021-04-24 (×2): 50 mg via ORAL
  Filled 2021-04-24 (×2): qty 1

## 2021-04-24 MED ORDER — SODIUM CHLORIDE 0.9 % IV SOLN
100.0000 mg | Freq: Two times a day (BID) | INTRAVENOUS | Status: DC
  Administered 2021-04-24 – 2021-04-25 (×3): 100 mg via INTRAVENOUS
  Filled 2021-04-24 (×4): qty 100

## 2021-04-24 NOTE — Consult Note (Signed)
NAME: Brian Gordon  DOB: 02/25/1991  MRN: [id number]  Date/Time: 04/24/2021 2:16 PM  REQUESTING PROVIDER: Dr.Grunz Subjective:  REASON FOR CONSULT: fever ? Brian Hust is a 30 y.o. male with a h/o ADHD presented with 3 day h/o of chills, sweats, feeling hot, headache , nausea.. Pt says he was well a few days ago and then started having the above symptoms. He also noted a painful spot behind his left ear and wondered whether it was an insect bite. He scratched the area He has cough  which started after he came to the hospital.No sick contacts Lives with fiancee and her mother Pt has 10 cats at home. 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 CTA chest no PE or infiltrate - bilateral sub 4mm nodules considered benign I am asked to see the patient for his symptoms   Past Medical History:  Diagnosis Date   Allergy    Anxiety    Concussion    GERD (gastroesophageal reflux disease)     No past surgical history on file.  Social History   Socioeconomic History   Marital status: Single    Spouse name: Not on file   Number of children: Not on file   Years of education: high school   Highest education level: High school graduate  Occupational History   Occupation: Editor, commissioningDairy stocker  Tobacco Use   Smoking status: Every Day    Packs/day: 0.50    Years: 10.00    Pack years: 5.00    Types: Cigarettes   Smokeless tobacco: Never  Vaping Use   Vaping Use: Never used  Substance and Sexual Activity   Alcohol use: Not Currently    Comment: quit 3-4 years ago, approx 2018   Drug use: Not Currently    Types: Marijuana    Comment: CBD OTC   Sexual activity: Not on file  Other Topics Concern    Not on file  Social History Narrative   Not on file   Social Determinants of Health   Financial Resource Strain: Not on file  Food Insecurity: Not on file  Transportation Needs: Not on file  Physical Activity: Not on file  Stress: Not on file  Social Connections: Not on file  Intimate Partner Violence: Not on file    Family History  Problem Relation Age of Onset   Depression Mother    Depression Sister    Anxiety disorder Sister    Allergies  Allergen Reactions   Cherry Anaphylaxis   I? Current Facility-Administered Medications  Medication Dose Route Frequency Provider Last Rate Last Admin   0.9 %  sodium chloride infusion   Intravenous Continuous Mansy, Jan A, MD 100 mL/hr at 04/24/21 1206 New Bag at 04/24/21 1206   acetaminophen (TYLENOL) tablet 650 mg  650 mg Oral Q4H PRN Mansy, Jan A, MD   650 mg at 04/24/21 1217   ALPRAZolam (XANAX) tablet 0.25 mg  0.25 mg Oral BID PRN Mansy, Jan A, MD   0.25 mg at 04/24/21 0009   alum & mag hydroxide-simeth (MAALOX/MYLANTA) 200-200-20 MG/5ML suspension 30 mL  30 mL Oral Q4H PRN Mansy, Jan  A, MD       aspirin EC tablet 325 mg  325 mg Oral Daily Mansy, Jan A, MD   325 mg at 04/24/21 0920   atorvastatin (LIPITOR) tablet 80 mg  80 mg Oral Daily Mansy, Jan A, MD   80 mg at 04/24/21 1194   baclofen (LIORESAL) tablet 5-10 mg  5-10 mg Oral TID PRN Mansy, Jan A, MD   10 mg at 04/24/21 1740   calcium carbonate (TUMS - dosed in mg elemental calcium) chewable tablet 200 mg of elemental calcium  1 tablet Oral Daily PRN Mansy, Jan A, MD       cyclobenzaprine (FLEXERIL) tablet 5 mg  5 mg Oral TID PRN Mansy, Jan A, MD       doxycycline (VIBRAMYCIN) 100 mg in sodium chloride 0.9 % 250 mL IVPB  100 mg Intravenous Q12H Mansy, Jan A, MD 125 mL/hr at 04/24/21 1208 100 mg at 04/24/21 1208   enoxaparin (LOVENOX) injection 40 mg  40 mg Subcutaneous Q24H Mansy, Jan A, MD   40 mg at 04/24/21 0009   magnesium hydroxide (MILK OF MAGNESIA) suspension 30 mL  30 mL  Oral Daily PRN Mansy, Jan A, MD       nitroGLYCERIN (NITROSTAT) SL tablet 0.4 mg  0.4 mg Sublingual Q5 Min x 3 PRN Mansy, Jan A, MD   0.4 mg at 04/24/21 0808   ondansetron (ZOFRAN) injection 4 mg  4 mg Intravenous Q6H PRN Mansy, Jan A, MD   4 mg at 04/24/21 0302   traMADol (ULTRAM) tablet 50 mg  50 mg Oral Q6H PRN Tyrone Nine, MD   50 mg at 04/24/21 1142   traZODone (DESYREL) tablet 25 mg  25 mg Oral QHS PRN Mansy, Jan A, MD       zolpidem (AMBIEN) tablet 5 mg  5 mg Oral QHS PRN,MR X 1 Mansy, Jan A, MD         Abtx:  Anti-infectives (From admission, onward)    Start     Dose/Rate Route Frequency Ordered Stop   04/24/21 0900  doxycycline (VIBRAMYCIN) 100 mg in sodium chloride 0.9 % 250 mL IVPB        100 mg 125 mL/hr over 120 Minutes Intravenous Every 12 hours 04/24/21 0157     04/23/21 2100  doxycycline (VIBRA-TABS) tablet 100 mg        100 mg Oral  Once 04/23/21 2050 04/23/21 2136       REVIEW OF SYSTEMS:  Const:  fever,  chills, negative weight loss Eyes: has some light sensitivity ENT: negative coryza, negative sore throat Resp:++cough, no hemoptysis, dyspnea Cards: negative for chest pain, palpitations, lower extremity edema GU: negative for frequency, dysuria and hematuria GI: Negative for abdominal pain, diarrhea, bleeding, constipation Skin: negative for rash and pruritus Heme: negative for easy bruising and gum/nose bleeding MS: general weakness Neurolo: headaches,   Psych: anxiety ADHD  Endocrine: negative for thyroid, diabetes Allergy/Immunology- cherry Objective:  VITALS:  BP 107/74 (BP Location: Left Arm)   Pulse 96   Temp 98.5 F (36.9 C) (Oral)   Resp 18   Ht 6\' 2"  (1.88 m)   Wt 76.2 kg   SpO2 100%   BMI 21.57 kg/m  PHYSICAL EXAM:  General: Alert, cooperative, no distress, appears stated age.  Head: Normocephalic, without obvious abnormality, atraumatic. Eyes: Conjunctivae clear, anicteric sclerae. Pupils are equal ENT Nares normal. No drainage or  sinus tenderness. Lips, mucosa, and tongue normal. No Thrush Poor dentition Neck:  Supple, symmetrical, no adenopathy, thyroid: non tender no carotid bruit and no JVD. Back: No CVA tenderness. Lungs: Clear to auscultation bilaterally. No Wheezing or Rhonchi. No rales. Heart: Regular rate and rhythm, no murmur, rub or gallop. Abdomen: Soft, non-tender,not distended. Bowel sounds normal. No masses Extremities: atraumatic, no cyanosis. No edema. No clubbing Skin: No rashes or lesions. Or bruising Behind left ear- there is an erythematous papule of 1 cm central scab   Lymph: Cervical, supraclavicular normal. Neurologic: Grossly non-focal Pertinent Labs Lab Results CBC    Component Value Date/Time   WBC 7.3 04/24/2021 0614   RBC 4.33 04/24/2021 0614   HGB 14.0 04/24/2021 0614   HCT 39.4 04/24/2021 0614   PLT 148 (L) 04/24/2021 0614   MCV 91.0 04/24/2021 0614   MCH 32.3 04/24/2021 0614   MCHC 35.5 04/24/2021 0614   RDW 12.1 04/24/2021 0614   LYMPHSABS 1.5 04/23/2021 1937   MONOABS 1.1 (H) 04/23/2021 1937   EOSABS 0.0 04/23/2021 1937   BASOSABS 0.0 04/23/2021 1937    CMP Latest Ref Rng & Units 04/24/2021 04/23/2021 02/06/2020  Glucose 70 - 99 mg/dL 782(U) 235(T) 614(E)  BUN 6 - 20 mg/dL 12 10 15   Creatinine 0.61 - 1.24 mg/dL 3.15 4.00  Sodium 135 - 145 mmol/L 135 138 140  Potassium 3.5 - 5.1 mmol/L 4.0 3.7 3.8  Chloride 98 - 111 mmol/L 102 104 105  CO2 22 - 32 mmol/L 22 25 27   Calcium 8.9 - 10.3 mg/dL 8.67) 9.3 9.7  Total Protein 6.5 - 8.1 g/dL - 8.0 )  Total Bilirubin 0.3 - 1.2 mg/dL - 0.7 0.8  Alkaline Phos 38 - 126 U/L - 75 58  AST 15 - 41 U/L - 19 15  ALT 0 - 44 U/L - 15 15      Microbiology: Recent Results (from the past 240 hour(s))  Resp Panel by RT-PCR (Flu A&B, Covid) Nasopharyngeal Swab     Status: None   Collection Time: 04/23/21  7:37 PM   Specimen: Nasopharyngeal Swab; Nasopharyngeal(NP) swabs in vial transport medium  Result Value Ref Range  Status   SARS Coronavirus 2 by RT PCR NEGATIVE NEGATIVE Final    Comment: (NOTE) SARS-CoV-2 target nucleic acids are NOT DETECTED.  The SARS-CoV-2 RNA is generally detectable in upper respiratory specimens during the acute phase of infection. The lowest concentration of SARS-CoV-2 viral copies this assay can detect is 138 copies/mL. A negative result does not preclude SARS-Cov-2 infection and should not be used as the sole basis for treatment or other patient management decisions. A negative result may occur with  improper specimen collection/handling, submission of specimen other than nasopharyngeal swab, presence of viral mutation(s) within the areas targeted by this assay, and inadequate number of viral copies(<138 copies/mL). A negative result must be combined with clinical observations, patient history, and epidemiological information. The expected result is Negative.  Fact Sheet for Patients:  5.0(D  Fact Sheet for Healthcare Providers:  04/25/21  This test is no t yet approved or cleared by the [employer] FDA and  has been authorized for detection and/or diagnosis of SARS-CoV-2 by FDA under an Emergency Use Authorization (EUA). This EUA will remain  in effect (meaning this test can be used) for the duration of the COVID-19 declaration under Section 564(b)(1) of the Act, 21 U.S.C.section 360bbb-3(b)(1), unless the authorization is terminated  or revoked sooner.       Influenza A by PCR NEGATIVE NEGATIVE Final   Influenza B by PCR  NEGATIVE NEGATIVE Final    Comment: (NOTE) The Xpert Xpress SARS-CoV-2/FLU/RSV plus assay is intended as an aid in the diagnosis of influenza from Nasopharyngeal swab specimens and should not be used as a sole basis for treatment. Nasal washings and aspirates are unacceptable for Xpert Xpress SARS-CoV-2/FLU/RSV testing.  Fact Sheet for  Patients: [employer]  Fact Sheet for Healthcare Providers: [employer]it  This test is not yet approved or cleared by the [place] FDA and has been authorized for detection and/or diagnosis of SARS-CoV-2 by FDA under an Emergency Use Authorization (EUA). This EUA will remain in effect (meaning this test can be used) for the duration of the COVID-19 declaration under Section 564(b)(1) of the Act, 21 U.S.C. section 360bbb-3(b)(1), unless the authorization is terminated or revoked.  Performed at [facility], [address] [place], [place] [address]     IMAGING RESULTS: I have personally reviewed the films ? Impression/Recommendation ?30 yr old male with3 day h/o fever, sweats, headache, cough , increased tropnin  Viral illness- R/o entero, coxsackie  Covid negative Will get RESP PCR panel  Erythematous papular lesion behind left ear- he did not notice any insect bite, did not pull any ticks. 10 cats  D.D  Insect bite Does not look like typical eschar of rickettsial illness ?? Staph infection  Pt is on Doxycycline and will continue- This will cover any of the above illness  Headaches- Could he have aseptic meningitis due to a viral illness? Possible No herpetic lesions on his lips- I did not examine his genitalia but he says he has none  Increased troponin and RBBB- viral myocarditis would be a concern Unlikely to be lyme  ? ? ___________________________________________________ Discussed with patient, requesting provider I will continue to follow him  Note:  This document was prepared using Dragon voice recognition software and may include unintentional dictation errors.

## 2021-04-24 NOTE — Consult Note (Signed)
[facility] Cardiology Consultation Note  Patient ID: Brian Gordon, MRN: [id number], DOB/AGE: April 16, 1991 29 y.o. Admit date: 04/23/2021   Date of Consult: 04/24/2021 Primary Physician: Smitty Cords, DO Primary Cardiologist: None  Chief Complaint:  Chief Complaint  Patient presents with   Insect Bite   Reason for Consult:  Chest pain with troponin  HPI: 30 y.o. male with acute episode of significant headache weakness fatigue fever and other constitutional symptoms as well as chest pain.  The chest pain is on and off and slightly with increased breathing.  There has been concerns of cardiovascular involvement of a possible viral illness.  The patient does have an EKG today showing sinus tachycardia with right bundle branch block and elevated troponin possibly consistent with myocarditis.  Elevated troponin is minimal not concerning for significant injury.  The patient's symptoms have been stable since admission.  Exam would suggest no evidence of pericarditis or other involvement.  Echocardiogram is pending  Past Medical History:  Diagnosis Date   Allergy    Anxiety    Concussion    GERD (gastroesophageal reflux disease)       Surgical History: No past surgical history on file.   Home Meds: Prior to Admission medications   Medication Sig Start Date End Date Taking? Authorizing Provider  baclofen (LIORESAL) 10 MG tablet Take 0.5-1 tablets (5-10 mg total) by mouth 3 (three) times daily as needed for muscle spasms. 03/07/20   Karamalegos, Netta Neat, DO  calcium carbonate (TUMS - DOSED IN MG ELEMENTAL CALCIUM) 500 MG chewable tablet Chew 1 tablet by mouth daily as needed for indigestion or heartburn.    [provider]  cyclobenzaprine (FLEXERIL) 5 MG tablet Take 1 tablet (5 mg total) by mouth 3 (three) times daily as needed for muscle spasms. 03/03/20   Lorre Munroe, NP  pantoprazole (PROTONIX) 20 MG tablet Take 1 tablet (20 mg total) by mouth daily before  breakfast. Patient not taking: Reported on 04/23/2021 03/07/20   Smitty Cords, DO  predniSONE (DELTASONE) 10 MG tablet Take 3 tabs on days 1-3, take 2 tabs on days 4-6, take 1 tab on days 7-9 03/03/20   Lorre Munroe, NP  promethazine (PHENERGAN) 12.5 MG tablet Take 1 tablet (12.5 mg total) by mouth every 6 (six) hours as needed for nausea or vomiting. Patient not taking: Reported on 04/17/2018 10/13/17 03/03/20  Willy Eddy, MD    Inpatient Medications:   aspirin EC  325 mg Oral Daily   atorvastatin  80 mg Oral Daily   enoxaparin (LOVENOX) injection  40 mg Subcutaneous Q24H    sodium chloride 100 mL/hr at 04/24/21 1206   doxycycline (VIBRAMYCIN) IV 100 mg (04/24/21 1208)    Allergies:  Allergies  Allergen Reactions   Cherry Anaphylaxis    Social History   Socioeconomic History   Marital status: Single    Spouse name: Not on file   Number of children: Not on file   Years of education: high school   Highest education level: High school graduate  Occupational History   Occupation: Editor, commissioning  Tobacco Use   Smoking status: Every Day    Packs/day: 0.50    Years: 10.00    Pack years: 5.00    Types: Cigarettes   Smokeless tobacco: Never  Vaping Use   Vaping Use: Never used  Substance and Sexual Activity   Alcohol use: Not Currently    Comment: quit 3-4 years ago, approx 2018   Drug use: Not  Currently    Types: Marijuana    Comment: CBD OTC   Sexual activity: Not on file  Other Topics Concern   Not on file  Social History Narrative   Not on file   Social Determinants of Health   Financial Resource Strain: Not on file  Food Insecurity: Not on file  Transportation Needs: Not on file  Physical Activity: Not on file  Stress: Not on file  Social Connections: Not on file  Intimate Partner Violence: Not on file     Family History  Problem Relation Age of Onset   Depression Mother    Depression Sister    Anxiety disorder Sister      Review of  Systems Positive for chest pain headache Negative for: General:  chills, fever, night sweats or weight changes.  Cardiovascular: PND orthopnea syncope dizziness  Dermatological skin lesions rashes Respiratory: Cough congestion Urologic: Frequent urination urination at night and hematuria Abdominal: negative for nausea, vomiting, diarrhea, bright red blood per rectum, melena, or hematemesis Neurologic: negative for visual changes, and/or hearing changes  All other systems reviewed and are otherwise negative except as noted above.  Labs: No results for input(s): CKTOTAL, CKMB, TROPONINI in the last 72 hours. Lab Results  Component Value Date   WBC 7.3 04/24/2021   HGB 14.0 04/24/2021   HCT 39.4 04/24/2021   MCV 91.0 04/24/2021   PLT 148 (L) 04/24/2021    Recent Labs  Lab 04/23/21 1937 04/24/21 0614  NA 138 135  K 3.7 4.0  CL 104 102  CO2 25 22  BUN 10 12  CREATININE 0.86 0.77  CALCIUM 9.3 8.6*  PROT 8.0  --   BILITOT 0.7  --   ALKPHOS 75  --   ALT 15  --   AST 19  --   GLUCOSE 105* 110*   Lab Results  Component Value Date   CHOL 116 04/24/2021   HDL 27 (L) 04/24/2021   LDLCALC 66 04/24/2021   TRIG 116 04/24/2021   No results found for: DDIMER  Radiology/Studies:  CT Angio Chest PE W and/or Wo Contrast  Result Date: 04/23/2021 CLINICAL DATA:  Spider bite, fever and chills, headache, nausea and vomiting EXAM: CT ANGIOGRAPHY CHEST WITH CONTRAST TECHNIQUE: Multidetector CT imaging of the chest was performed using the standard protocol during bolus administration of intravenous contrast. Multiplanar CT image reconstructions and MIPs were obtained to evaluate the vascular anatomy. CONTRAST:  15mL OMNIPAQUE IOHEXOL 350 MG/ML SOLN COMPARISON:  None. FINDINGS: Cardiovascular: This is a technically adequate evaluation of the pulmonary vasculature. No filling defects or pulmonary emboli. The heart is unremarkable without pericardial effusion. No evidence of thoracic aortic  aneurysm or dissection. Mediastinum/Nodes: No enlarged mediastinal, hilar, or axillary lymph nodes. Thyroid gland, trachea, and esophagus demonstrate no significant findings. Lungs/Pleura: No acute airspace disease, effusion, or pneumothorax. There is a 4 mm right middle lobe pulmonary nodule identified, reference image 67/6. 3 mm subpleural left upper lobe pulmonary nodules identified on image 66/6. Central airways are patent. Upper Abdomen: No acute abnormality. Musculoskeletal: No acute or destructive bony lesions. Reconstructed images demonstrate no additional findings. Review of the MIP images confirms the above findings. IMPRESSION: 1. No evidence of pulmonary embolus. 2. Bilateral sub 4 mm pulmonary nodules, almost certainly benign in a patient of this age. No follow-up needed if patient is low-risk (and has no known or suspected primary neoplasm). Non-contrast chest CT can be considered in 12 months if patient is high-risk. This recommendation follows the consensus  statement: Guidelines for Management of Incidental Pulmonary Nodules Detected on CT Images: From the Fleischner Society 2017; Radiology 2017; 284:228-243. Electronically Signed   By: Sharlet Salina M.D.   On: 04/23/2021 21:39    EKG: Sinus tachycardia with right bundle branch block  Weights: Filed Weights   04/23/21 1818 04/24/21 1000  Weight: 72.6 kg 76.2 kg     Physical Exam: Blood pressure 104/70, pulse 99, temperature 98.6 F (37 C), temperature source Oral, resp. rate 18, height 6\' 2"  (1.88 m), weight 76.2 kg, SpO2 95 %. Body mass index is 21.57 kg/m. General: Well developed, well nourished, in no acute distress. Head eyes ears nose throat: Normocephalic, atraumatic, sclera non-icteric, no xanthomas, nares are without discharge. No apparent thyromegaly and/or mass  Lungs: Normal respiratory effort.  no wheezes, no rales, no rhonchi.  Heart: RRR with normal S1 S2. no murmur gallop, no rub, PMI is normal size and placement,  carotid upstroke normal without bruit, jugular venous pressure is normal Abdomen: Soft, non-tender, non-distended with normoactive bowel sounds. No hepatomegaly. No rebound/guarding. No obvious abdominal masses. Abdominal aorta is normal size without bruit Extremities: No edema. no cyanosis, no clubbing, no ulcers  Peripheral : 2+ bilateral upper extremity pulses, 2+ bilateral femoral pulses, 2+ bilateral dorsal pedal pulse Neuro: Alert and oriented. No facial asymmetry. No focal deficit. Moves all extremities spontaneously. Musculoskeletal: Normal muscle tone without kyphosis Psych:  Responds to questions appropriately with a normal affect.    Assessment: 30 year old male with constitutional symptoms with weakness fatigue headache other concerns as well as chest pain without evidence of clinical pericarditis but possible myocarditis as a secondary issue  Plan: 1.  Continue supportive care for possible viral illness or other types of infection and further treatment options as per infectious disease and medicine 2.  Echocardiogram for LV systolic dysfunction valvular heart disease 3.  No further interventionOr and patient may be discharged home from cardiac standpoint if improved from a  Signed, 27 M.D. [facility] Cardiology 04/24/2021, 12:35 PM

## 2021-04-24 NOTE — Progress Notes (Signed)
*  PRELIMINARY RESULTS* Echocardiogram 2D Echocardiogram has been performed.  Brian Gordon 04/24/2021, 11:33 AM

## 2021-04-24 NOTE — Progress Notes (Addendum)
PROGRESS NOTE  Brian Gordon  HUT:[id number] DOB: 01/05/1991 DOA: 04/23/2021 PCP: Olin Hauser, DO   Brief Narrative: Brian Gordon is a 30 y.o. male with a history of GERD, depression, anxiety, ADHD who presented to the ED 7/19 with 3 days of fever, chills, myalgias, poor appetite, headache in the setting of a bite from something on the left posterior scalp. In the ED he was afebrile, tachycardic with RBBB on ECG, complained of chest discomfort and troponin found to be 158. WBC, PCT, and ESR were normal. Flu and covid negative. CTA chest revealed no PE, bilateral <12m nodules. Serology for lyme disease and RMSF sent, doxycycline started, and the patient was admitted. Cardiology was consulted for concern of myocarditis, though felt no further work up was necessary after echocardiogram was normal. Infectious disease is consulted.   Assessment & Plan: Active Problems:   NSTEMI (non-ST elevated myocardial infarction) (HCC)  Fever, chills: Lab work up reassuring thus far, though uncertain underlying diagnosis/infectious source of constitutional symptoms.  - Discussed with ID, Dr. RDelaine Lame Appreciate their evaluation. Will keep on doxycycline for now and plan to follow up serology for Lyme and RMSF. Pt has neck pain and reported fever, though no gross meningismus on exam, so not urgently pursuing LP at this time. Defer to ID.  Headache: Also neck pain, but no meningismus on exam.  - Tylenol and tramadol prn. Can also use toradol.   Nausea and vomiting:  - Trial diet, antiemetics prn.   Troponin elevation, demand ischemia, possible viral myocarditis: Cardiology recommends no further workup or intervention. Echocardiogram shows normal biventricular function without wall motion abnormalities, valvular disease, or pericardial fluid.   GERD:  - Continue PPI  Depression, anxiety, ADHD:  - Psychiatry was consulted.   Thrombocytopenia:  - Recheck in AM  DVT prophylaxis: Lovenox Code  Status: Full Family Communication: Fiance at bedside Disposition Plan:  Status is: Inpatient  Remains inpatient appropriate because:Ongoing active pain requiring inpatient pain management and Ongoing diagnostic testing needed not appropriate for outpatient work up  Dispo: The patient is from: Home              Anticipated d/c is to: Home              Patient currently is not medically stable to d/c.   Difficult to place patient No  Consultants:  ID Cardiology, Dr. KNehemiah Massed Procedures:  None  Antimicrobials: Doxycycline   Subjective: Denies rashes. Still can't get warm (multiple covers, but also with fan on face). No current chest pain, but headache is 10/10, global, constant, much worse with lights on.   Objective: Vitals:   04/24/21 0800 04/24/21 0830 04/24/21 1000 04/24/21 1248  BP: 118/83 108/73 104/70 107/74  Pulse: 95 (!) 101 99 96  Resp: _0 Temp:   98.6 F (37 C) 98.5 F (36.9 C)  TempSrc:   Oral Oral  SpO2: 96% 95%  100%  Weight:   76.2 kg   Height:   _1  (1.88 m)     Intake/Output Summary (Last 24 hours) at 04/24/2021 1439 Last data filed at 04/23/2021 2145 Gross per 24 hour  Intake 1000 ml  Output --  Net 1000 ml   Filed Weights   04/23/21 1818 04/24/21 1000  Weight: 72.6 kg 76.2 kg    Gen: Thin young man in no distress Neck: No meningismus.  Pulm: Non-labored breathing room air. Clear to auscultation bilaterally.  CV: Regular rate and rhythm. No murmur, rub,  or gallop. No JVD, no pitting pedal edema. GI: Abdomen soft, non-tender, non-distended, with normoactive bowel sounds. No organomegaly or masses felt. Ext: Warm, no deformities Skin: Left small area of round (~1cm) erythema extending from small punctum with eschar without discharge, induration or fluctuance just inside hairline of scalp on the left posterior to the ear. No other rashes, lesions or ulcers Neuro: Alert and oriented. No CN or other palsy. Psych: Judgement and insight  appear normal. Mood & affect appropriate.   Data Reviewed: I have personally reviewed following labs and imaging studies  CBC: Recent Labs  Lab 04/23/21 1937 04/24/21 0614  WBC 7.2 7.3  NEUTROABS 4.6  --   HGB 16.5 14.0  HCT 46.4 39.4  MCV 89.9 91.0  PLT 162 559*   Basic Metabolic Panel: Recent Labs  Lab 04/23/21 1937 04/24/21 0614  NA 138 135  K 3.7 4.0  CL 104 102  CO2 25 22  GLUCOSE 105* 110*  BUN 10 12  CREATININE 0.86 0.77  CALCIUM 9.3 8.6*  MG 2.0  --    GFR: Estimated Creatinine Clearance: 148.2 mL/min (by C-G formula based on SCr of 0.77 mg/dL). Liver Function Tests: Recent Labs  Lab 04/23/21 1937  AST 19  ALT 15  ALKPHOS 75  BILITOT 0.7  PROT 8.0  ALBUMIN 4.3   No results for input(s): LIPASE, AMYLASE in the last 168 hours. No results for input(s): AMMONIA in the last 168 hours. Coagulation Profile: No results for input(s): INR, PROTIME in the last 168 hours. Cardiac Enzymes: No results for input(s): CKTOTAL, CKMB, CKMBINDEX, TROPONINI in the last 168 hours. BNP (last 3 results) No results for input(s): PROBNP in the last 8760 hours. HbA1C: No results for input(s): HGBA1C in the last 72 hours. CBG: No results for input(s): GLUCAP in the last 168 hours. Lipid Profile: Recent Labs    04/24/21 0614  CHOL 116  HDL 27*  LDLCALC 66  TRIG 116  CHOLHDL 4.3   Thyroid Function Tests: No results for input(s): TSH, T4TOTAL, FREET4, T3FREE, THYROIDAB in the last 72 hours. Anemia Panel: No results for input(s): VITAMINB12, FOLATE, FERRITIN, TIBC, IRON, RETICCTPCT in the last 72 hours. Urine analysis:    Component Value Date/Time   COLORURINE YELLOW 02/06/2020 1524   APPEARANCEUR CLEAR 02/06/2020 1524   LABSPEC >1.030 (H) 02/06/2020 1524   PHURINE 5.5 02/06/2020 1524   GLUCOSEU NEGATIVE 02/06/2020 1524   HGBUR NEGATIVE 02/06/2020 1524   BILIRUBINUR SMALL (A) 02/06/2020 1524   KETONESUR 15 (A) 02/06/2020 1524   PROTEINUR NEGATIVE 02/06/2020  1524   NITRITE NEGATIVE 02/06/2020 1524   LEUKOCYTESUR NEGATIVE 02/06/2020 1524   Recent Results (from the past 240 hour(s))  Resp Panel by RT-PCR (Flu A&B, Covid) Nasopharyngeal Swab     Status: None   Collection Time: 04/23/21  7:37 PM   Specimen: Nasopharyngeal Swab; Nasopharyngeal(NP) swabs in vial transport medium  Result Value Ref Range Status   SARS Coronavirus 2 by RT PCR NEGATIVE NEGATIVE Final    Comment: (NOTE) SARS-CoV-2 target nucleic acids are NOT DETECTED.  The SARS-CoV-2 RNA is generally detectable in upper respiratory specimens during the acute phase of infection. The lowest concentration of SARS-CoV-2 viral copies this assay can detect is 138 copies/mL. A negative result does not preclude SARS-Cov-2 infection and should not be used as the sole basis for treatment or other patient management decisions. A negative result may occur with  improper specimen collection/handling, submission of specimen other than nasopharyngeal swab, presence of viral  mutation(s) within the areas targeted by this assay, and inadequate number of viral copies(<138 copies/mL). A negative result must be combined with clinical observations, patient history, and epidemiological information. The expected result is Negative.  Fact Sheet for Patients:  EntrepreneurPulse.com.au  Fact Sheet for Healthcare Providers:  IncredibleEmployment.be  This test is no t yet approved or cleared by the [place] FDA and  has been authorized for detection and/or diagnosis of SARS-CoV-2 by FDA under an Emergency Use Authorization (EUA). This EUA will remain  in effect (meaning this test can be used) for the duration of the COVID-19 declaration under Section 564(b)(1) of the Act, 21 U.S.C.section 360bbb-3(b)(1), unless the authorization is terminated  or revoked sooner.       Influenza A by PCR NEGATIVE NEGATIVE Final   Influenza B by PCR NEGATIVE NEGATIVE Final     Comment: (NOTE) The Xpert Xpress SARS-CoV-2/FLU/RSV plus assay is intended as an aid in the diagnosis of influenza from Nasopharyngeal swab specimens and should not be used as a sole basis for treatment. Nasal washings and aspirates are unacceptable for Xpert Xpress SARS-CoV-2/FLU/RSV testing.  Fact Sheet for Patients: EntrepreneurPulse.com.au  Fact Sheet for Healthcare Providers: IncredibleEmployment.be  This test is not yet approved or cleared by the [place] FDA and has been authorized for detection and/or diagnosis of SARS-CoV-2 by FDA under an Emergency Use Authorization (EUA). This EUA will remain in effect (meaning this test can be used) for the duration of the COVID-19 declaration under Section 564(b)(1) of the Act, 21 U.S.C. section 360bbb-3(b)(1), unless the authorization is terminated or revoked.  Performed at [facility], [address], [place], [place] [address]       Radiology Studies: CT Angio Chest PE W and/or Wo Contrast  Result Date: 04/23/2021 CLINICAL DATA:  Spider bite, fever and chills, headache, nausea and vomiting EXAM: CT ANGIOGRAPHY CHEST WITH CONTRAST TECHNIQUE: Multidetector CT imaging of the chest was performed using the standard protocol during bolus administration of intravenous contrast. Multiplanar CT image reconstructions and MIPs were obtained to evaluate the vascular anatomy. CONTRAST:  9m OMNIPAQUE IOHEXOL 350 MG/ML SOLN COMPARISON:  None. FINDINGS: Cardiovascular: This is a technically adequate evaluation of the pulmonary vasculature. No filling defects or pulmonary emboli. The heart is unremarkable without pericardial effusion. No evidence of thoracic aortic aneurysm or dissection. Mediastinum/Nodes: No enlarged mediastinal, hilar, or axillary lymph nodes. Thyroid gland, trachea, and esophagus demonstrate no significant findings. Lungs/Pleura: No acute airspace disease, effusion, or pneumothorax.  There is a 4 mm right middle lobe pulmonary nodule identified, reference image 67/6. 3 mm subpleural left upper lobe pulmonary nodules identified on image 66/6. Central airways are patent. Upper Abdomen: No acute abnormality. Musculoskeletal: No acute or destructive bony lesions. Reconstructed images demonstrate no additional findings. Review of the MIP images confirms the above findings. IMPRESSION: 1. No evidence of pulmonary embolus. 2. Bilateral sub 4 mm pulmonary nodules, almost certainly benign in a patient of this age. No follow-up needed if patient is low-risk (and has no known or suspected primary neoplasm). Non-contrast chest CT can be considered in 12 months if patient is high-risk. This recommendation follows the consensus statement: Guidelines for Management of Incidental Pulmonary Nodules Detected on CT Images: From the Fleischner Society 2017; Radiology 2017; 284:228-243. Electronically Signed   By: MRanda NgoM.D.   On: 04/23/2021 21:39   ECHOCARDIOGRAM COMPLETE  Result Date: 04/24/2021    ECHOCARDIOGRAM REPORT   Patient Name:   [email or web address] Date of Exam: 04/24/2021 Medical Rec #:  [id number]     Height:       73.0 in Accession #:    [id number]    Weight:       160.0 lb Date of Birth:  [birth year]      BSA:          1.957 m Patient Age:    29 years      BP:           103/67 mmHg Patient Gender: M             HR:           91 bpm. Exam Location:  [facility] Procedure: 2D Echo, Color Doppler, Cardiac Doppler and Strain Analysis Indications:     Elevated troponin  History:         Patient has no prior history of Echocardiogram examinations.                  Signs/Symptoms:Chest Pain; Risk Factors:Current Smoker.  Sonographer:     Charmayne Sheer RDCS (AE) Referring Phys:  [id number] Arvella Merles MANSY Diagnosing Phys: Kate Sable MD  Sonographer Comments: Global longitudinal strain was attempted. IMPRESSIONS  1. Left ventricular ejection fraction, by estimation, is 55 to 60%. The left ventricle has normal  function. The left ventricle has no regional wall motion abnormalities. Left ventricular diastolic parameters were normal. The average left ventricular global longitudinal strain is -17.1 %. The global longitudinal strain is normal.  2. Right ventricular systolic function is normal. The right ventricular size is normal.  3. The mitral valve is normal in structure. No evidence of mitral valve regurgitation. No evidence of mitral stenosis.  4. The aortic valve is normal in structure. Aortic valve regurgitation is not visualized. No aortic stenosis is present.  5. The inferior vena cava is normal in size with greater than 50% respiratory variability, suggesting right atrial pressure of 3 mmHg. FINDINGS  Left Ventricle: Left ventricular ejection fraction, by estimation, is 55 to 60%. The left ventricle has normal function. The left ventricle has no regional wall motion abnormalities. The average left ventricular global longitudinal strain is -17.1 %. The global longitudinal strain is normal. The left ventricular internal cavity size was normal in size. There is no left ventricular hypertrophy. Left ventricular diastolic parameters were normal. Right Ventricle: The right ventricular size is normal. No increase in right ventricular wall thickness. Right ventricular systolic function is normal. Left Atrium: Left atrial size was normal in size. Right Atrium: Right atrial size was normal in size. Pericardium: There is no evidence of pericardial effusion. Mitral Valve: The mitral valve is normal in structure. No evidence of mitral valve regurgitation. No evidence of mitral valve stenosis. MV peak gradient, 2.1 mmHg. The mean mitral valve gradient is 1.0 mmHg. Tricuspid Valve: The tricuspid valve is normal in structure. Tricuspid valve regurgitation is not demonstrated. No evidence of tricuspid stenosis. Aortic Valve: The aortic valve is normal in structure. Aortic valve regurgitation is not visualized. No aortic stenosis is  present. Aortic valve mean gradient measures 2.0 mmHg. Aortic valve peak gradient measures 3.9 mmHg. Aortic valve area, by VTI measures 4.31 cm. Pulmonic Valve: The pulmonic valve was normal in structure. Pulmonic valve regurgitation is not visualized. No evidence of pulmonic stenosis. Aorta: The aortic root is normal in size and structure. Venous: The inferior vena cava is normal in size with greater than 50% respiratory variability, suggesting right atrial pressure of 3 mmHg. IAS/Shunts: No atrial level shunt detected by color flow Doppler.  LEFT VENTRICLE PLAX 2D LVIDd:         4.30 cm  Diastology LVIDs:         3.00 cm  LV e' medial:    9.14 cm/s LV PW:         1.10 cm  LV E/e' medial:  7.3 LV IVS:        0.80 cm  LV e' lateral:   16.10 cm/s LVOT diam:     2.10 cm  LV E/e' lateral: 4.1 LV SV:         64 LV SV Index:   33       2D Longitudinal Strain LVOT Area:     3.46 cm 2D Strain GLS Avg:     -17.1 %  LEFT ATRIUM             Index LA diam:        3.10 cm 1.58 cm/m LA Vol (A2C):   27.5 ml 14.05 ml/m LA Vol (A4C):   19.3 ml 9.86 ml/m LA Biplane Vol: 23.6 ml 12.06 ml/m  AORTIC VALVE                   PULMONIC VALVE AV Area (Vmax):    3.48 cm    PV Vmax:       0.87 m/s AV Area (Vmean):   3.87 cm    PV Vmean:      58.000 cm/s AV Area (VTI):     4.31 cm    PV VTI:        0.124 m AV Vmax:           98.60 cm/s  PV Peak grad:  3.0 mmHg AV Vmean:          66.300 cm/s PV Mean grad:  2.0 mmHg AV VTI:            0.148 m AV Peak Grad:      3.9 mmHg AV Mean Grad:      2.0 mmHg LVOT Vmax:         99.20 cm/s LVOT Vmean:        74.100 cm/s LVOT VTI:          0.184 m LVOT/AV VTI ratio: 1.24  AORTA Ao Root diam: 2.90 cm MITRAL VALVE MV Area (PHT): 6.32 cm    SHUNTS MV Area VTI:   4.45 cm    Systemic VTI:  0.18 m MV Peak grad:  2.1 mmHg    Systemic Diam: 2.10 cm MV Mean grad:  1.0 mmHg MV Vmax:       0.72 m/s MV Vmean:      50.3 cm/s MV Decel Time: 120 msec MV E velocity: 66.40 cm/s MV A velocity: 62.10 cm/s MV E/A  ratio:  1.07 Kate Sable MD Electronically signed by Kate Sable MD Signature Date/Time: 04/24/2021/1:31:53 PM    Final     Scheduled Meds:  aspirin EC  325 mg Oral Daily   atorvastatin  80 mg Oral Daily   enoxaparin (LOVENOX) injection  40 mg Subcutaneous Q24H   Continuous Infusions:  sodium chloride 100 mL/hr at 04/24/21 1206   doxycycline (VIBRAMYCIN) IV 100 mg (04/24/21 1208)     LOS: 1 day   Time spent: 25 minutes.  Patrecia Pour, MD [facility] [email or web address] 04/24/2021, 2:39 PM

## 2021-04-24 NOTE — ED Notes (Signed)
Lab at the bedside 

## 2021-04-25 DIAGNOSIS — A770 Spotted fever due to Rickettsia rickettsii: Principal | ICD-10-CM

## 2021-04-25 LAB — CBC
HCT: 37.9 % — ABNORMAL LOW (ref 39.0–52.0)
Hemoglobin: 13.5 g/dL (ref 13.0–17.0)
MCH: 31.5 pg (ref 26.0–34.0)
MCHC: 35.6 g/dL (ref 30.0–36.0)
MCV: 88.6 fL (ref 80.0–100.0)
Platelets: 145 10*3/uL — ABNORMAL LOW (ref 150–400)
RBC: 4.28 MIL/uL (ref 4.22–5.81)
RDW: 11.7 % (ref 11.5–15.5)
WBC: 6.6 10*3/uL (ref 4.0–10.5)
nRBC: 0 % (ref 0.0–0.2)

## 2021-04-25 LAB — TROPONIN I (HIGH SENSITIVITY): Troponin I (High Sensitivity): 85 ng/L — ABNORMAL HIGH (ref <18)

## 2021-04-25 LAB — LYME DISEASE SEROLOGY W/REFLEX: Lyme Total Antibody EIA: NEGATIVE

## 2021-04-25 MED ORDER — DOXYCYCLINE HYCLATE 100 MG PO TABS: 100.0000 mg | ORAL_TABLET | Freq: Two times a day (BID) | ORAL | 0 refills | Status: AC

## 2021-04-25 NOTE — Progress Notes (Signed)
Date of Admission:  04/23/2021       Subjective: Pt doing better Says he has no cold, no chills, does not feel hot,  cough better No headache Wants to go home   Medications:   aspirin EC  325 mg Oral Daily   atorvastatin  80 mg Oral Daily   enoxaparin (LOVENOX) injection  40 mg Subcutaneous Q24H    Objective: Vital signs in last 24 hours: Temp:  [98.2 F (36.8 C)-98.7 F (37.1 C)] 98.7 F (37.1 C) (07/21 0354) Pulse Rate:  [92-108] 108 (07/21 0353) Resp:  [14-20] 14 (07/21 0353) BP: (107-126)/(74-83) 126/83 (07/21 0353) SpO2:  [98 %-100 %] 100 % (07/21 0353)  PHYSICAL EXAM:  General: Alert, cooperative, no distress, appears stated age.  Head: Normocephalic, without obvious abnormality, atraumatic. Eyes: Conjunctivae clear, anicteric sclerae. Pupils are equal ENT Nares normal. No drainage or sinus tenderness. Lips, mucosa, and tongue normal. No Thrush Neck: Supple, symmetrical, no adenopathy, thyroid: non tender no carotid bruit and no JVD. Back: No CVA tenderness. Lungs: Clear to auscultation bilaterally. No Wheezing or Rhonchi. No rales. Heart: Regular rate and rhythm, no murmur, rub or gallop. Abdomen: Soft, non-tender,not distended. Bowel sounds normal. No masses Extremities: atraumatic, no cyanosis. No edema. No clubbing Skin: No rashes or lesions. Or bruising Lymph: Cervical, supraclavicular normal. Neurologic: Grossly non-focal  Lab Results Recent Labs    04/23/21 1937 04/24/21 0614 04/25/21 0449  WBC 7.2 7.3 6.6  HGB 16.5 14.0 13.5  HCT 46.4 39.4 37.9*  NA 138 135  --   K 3.7 4.0  --   CL 104 102  --   CO2 25 22  --   BUN 10 12  --   CREATININE 0.86 0.77  --    Liver Panel Recent Labs    04/23/21 1937  PROT 8.0  ALBUMIN 4.3  AST 19  ALT 15  ALKPHOS 75  BILITOT 0.7   Sedimentation Rate Recent Labs    04/23/21 1937  ESRSEDRATE 7   Microbiology: None available Urine tox screen positive for marijuana. Studies/Results: CT Angio  Chest PE W and/or Wo Contrast  Result Date: 04/23/2021 CLINICAL DATA:  Spider bite, fever and chills, headache, nausea and vomiting EXAM: CT ANGIOGRAPHY CHEST WITH CONTRAST TECHNIQUE: Multidetector CT imaging of the chest was performed using the standard protocol during bolus administration of intravenous contrast. Multiplanar CT image reconstructions and MIPs were obtained to evaluate the vascular anatomy. CONTRAST:  54mL OMNIPAQUE IOHEXOL 350 MG/ML SOLN COMPARISON:  None. FINDINGS: Cardiovascular: This is a technically adequate evaluation of the pulmonary vasculature. No filling defects or pulmonary emboli. The heart is unremarkable without pericardial effusion. No evidence of thoracic aortic aneurysm or dissection. Mediastinum/Nodes: No enlarged mediastinal, hilar, or axillary lymph nodes. Thyroid gland, trachea, and esophagus demonstrate no significant findings. Lungs/Pleura: No acute airspace disease, effusion, or pneumothorax. There is a 4 mm right middle lobe pulmonary nodule identified, reference image 67/6. 3 mm subpleural left upper lobe pulmonary nodules identified on image 66/6. Central airways are patent. Upper Abdomen: No acute abnormality. Musculoskeletal: No acute or destructive bony lesions. Reconstructed images demonstrate no additional findings. Review of the MIP images confirms the above findings. IMPRESSION: 1. No evidence of pulmonary embolus. 2. Bilateral sub 4 mm pulmonary nodules, almost certainly benign in a patient of this age. No follow-up needed if patient is low-risk (and has no known or suspected primary neoplasm). Non-contrast chest CT can be considered in 12 months if patient is high-risk. This recommendation follows  the consensus statement: Guidelines for Management of Incidental Pulmonary Nodules Detected on CT Images: From the Fleischner Society 2017; Radiology 2017; 284:228-243. Electronically Signed   By: Sharlet Salina M.D.   On: 04/23/2021 21:39   ECHOCARDIOGRAM  COMPLETE  Result Date: 04/24/2021    ECHOCARDIOGRAM REPORT   Patient Name:   Brian Gordon Date of Exam: 04/24/2021 Medical Rec #:  [id number]     Height:       73.0 in Accession #:    [id number]    Weight:       160.0 lb Date of Birth:  06/21/1991      BSA:          1.957 m Patient Age:    29 years      BP:           103/67 mmHg Patient Gender: M             HR:           91 bpm. Exam Location:  [facility] Procedure: 2D Echo, Color Doppler, Cardiac Doppler and Strain Analysis Indications:     Elevated troponin  History:         Patient has no prior history of Echocardiogram examinations.                  Signs/Symptoms:Chest Pain; Risk Factors:Current Smoker.  Sonographer:     Humphrey Rolls RDCS (AE) Referring Phys:  [id number] Vernetta Honey MANSY Diagnosing Phys: Debbe Odea MD  Sonographer Comments: Global longitudinal strain was attempted. IMPRESSIONS  1. Left ventricular ejection fraction, by estimation, is 55 to 60%. The left ventricle has normal function. The left ventricle has no regional wall motion abnormalities. Left ventricular diastolic parameters were normal. The average left ventricular global longitudinal strain is -17.1 %. The global longitudinal strain is normal.  2. Right ventricular systolic function is normal. The right ventricular size is normal.  3. The mitral valve is normal in structure. No evidence of mitral valve regurgitation. No evidence of mitral stenosis.  4. The aortic valve is normal in structure. Aortic valve regurgitation is not visualized. No aortic stenosis is present.  5. The inferior vena cava is normal in size with greater than 50% respiratory variability, suggesting right atrial pressure of 3 mmHg. FINDINGS  Left Ventricle: Left ventricular ejection fraction, by estimation, is 55 to 60%. The left ventricle has normal function. The left ventricle has no regional wall motion abnormalities. The average left ventricular global longitudinal strain is -17.1 %. The global longitudinal strain is  normal. The left ventricular internal cavity size was normal in size. There is no left ventricular hypertrophy. Left ventricular diastolic parameters were normal. Right Ventricle: The right ventricular size is normal. No increase in right ventricular wall thickness. Right ventricular systolic function is normal. Left Atrium: Left atrial size was normal in size. Right Atrium: Right atrial size was normal in size. Pericardium: There is no evidence of pericardial effusion. Mitral Valve: The mitral valve is normal in structure. No evidence of mitral valve regurgitation. No evidence of mitral valve stenosis. MV peak gradient, 2.1 mmHg. The mean mitral valve gradient is 1.0 mmHg. Tricuspid Valve: The tricuspid valve is normal in structure. Tricuspid valve regurgitation is not demonstrated. No evidence of tricuspid stenosis. Aortic Valve: The aortic valve is normal in structure. Aortic valve regurgitation is not visualized. No aortic stenosis is present. Aortic valve mean gradient measures 2.0 mmHg. Aortic valve peak gradient measures 3.9 mmHg. Aortic valve area, by VTI measures  4.31 cm. Pulmonic Valve: The pulmonic valve was normal in structure. Pulmonic valve regurgitation is not visualized. No evidence of pulmonic stenosis. Aorta: The aortic root is normal in size and structure. Venous: The inferior vena cava is normal in size with greater than 50% respiratory variability, suggesting right atrial pressure of 3 mmHg. IAS/Shunts: No atrial level shunt detected by color flow Doppler.  LEFT VENTRICLE PLAX 2D LVIDd:         4.30 cm  Diastology LVIDs:         3.00 cm  LV e' medial:    9.14 cm/s LV PW:         1.10 cm  LV E/e' medial:  7.3 LV IVS:        0.80 cm  LV e' lateral:   16.10 cm/s LVOT diam:     2.10 cm  LV E/e' lateral: 4.1 LV SV:         64 LV SV Index:   33       2D Longitudinal Strain LVOT Area:     3.46 cm 2D Strain GLS Avg:     -17.1 %  LEFT ATRIUM             Index LA diam:        3.10 cm 1.58 cm/m LA Vol  (A2C):   27.5 ml 14.05 ml/m LA Vol (A4C):   19.3 ml 9.86 ml/m LA Biplane Vol: 23.6 ml 12.06 ml/m  AORTIC VALVE                   PULMONIC VALVE AV Area (Vmax):    3.48 cm    PV Vmax:       0.87 m/s AV Area (Vmean):   3.87 cm    PV Vmean:      58.000 cm/s AV Area (VTI):     4.31 cm    PV VTI:        0.124 m AV Vmax:           98.60 cm/s  PV Peak grad:  3.0 mmHg AV Vmean:          66.300 cm/s PV Mean grad:  2.0 mmHg AV VTI:            0.148 m AV Peak Grad:      3.9 mmHg AV Mean Grad:      2.0 mmHg LVOT Vmax:         99.20 cm/s LVOT Vmean:        74.100 cm/s LVOT VTI:          0.184 m LVOT/AV VTI ratio: 1.24  AORTA Ao Root diam: 2.90 cm MITRAL VALVE MV Area (PHT): 6.32 cm    SHUNTS MV Area VTI:   4.45 cm    Systemic VTI:  0.18 m MV Peak grad:  2.1 mmHg    Systemic Diam: 2.10 cm MV Mean grad:  1.0 mmHg MV Vmax:       0.72 m/s MV Vmean:      50.3 cm/s MV Decel Time: 120 msec MV E velocity: 66.40 cm/s MV A velocity: 62.10 cm/s MV E/A ratio:  1.07 Debbe OdeaBrian Agbor-Etang MD Electronically signed by Debbe OdeaBrian Agbor-Etang MD Signature Date/Time: 04/24/2021/1:31:53 PM    Final      Assessment/Plan: 30 year old male presenting with subjective fever sweats headache cough.  Along with a small insect bite lesion behind his left ear.  Was found to have increased troponins.  No documented fever in the  hospital.  No leukocytosis.  Normal LFTs. The etiology of his illness is unclear.  He did have increased troponins.  Raising concern for myocarditis.  So viral illness like adeno versus coxsackievirus was suspected.  The respiratory viral PCR panel was negative. A small erythematous papular lesion behind the left ear was present.  He did not notice any specific insect bite and neither did he pull any ticks but he does have 10 cats so the differential diagnosis is an insect bite versus a Staph infection. It is much improved This does not look like a typical eschar of rickettsial illness. Patient is currently on doxycycline and as  he is felt better on it he will continue to complete for 7 days.  Discussed the management with the patient and the care team.

## 2021-04-25 NOTE — Plan of Care (Signed)
  Problem: Education: Goal: Knowledge of General Education information will improve Description Including pain rating scale, medication(s)/side effects and non-pharmacologic comfort measures Outcome: Progressing   Problem: Health Behavior/Discharge Planning: Goal: Ability to manage health-related needs will improve Outcome: Progressing   

## 2021-04-25 NOTE — Discharge Summary (Signed)
Physician Discharge Summary  Brian Gordon NWG:[id number] DOB: 16-Aug-1991 DOA: 04/23/2021  PCP: Olin Hauser, DO  Admit date: 04/23/2021 Discharge date: 04/25/2021  Admitted From: Home Disposition: Home   Recommendations for Outpatient Follow-up:  Follow up with PCP in 1-2 weeks after treatment for RMSF. Suggest repeat CBC for thrombocytopenia.  Consider cardiology follow up for elevated troponin, though echocardiogram was normal.  Home Health: None Equipment/Devices: None Discharge Condition: Stable CODE STATUS: Full Diet recommendation: Regular  Brief/Interim Summary: Brian Gordon is a 30 y.o. male with a history of GERD, depression, anxiety, ADHD who presented to the ED 7/19 with 3 days of fever, chills, myalgias, poor appetite, headache in the setting of a bite from something on the left posterior scalp. In the ED he was afebrile, tachycardic with RBBB on ECG, complained of chest discomfort and troponin found to be 158. WBC, PCT, and ESR were normal. Flu and covid negative. CTA chest revealed no PE, bilateral <90m nodules. Serology for lyme disease and RMSF sent, doxycycline started, and the patient was admitted. Cardiology was consulted for concern of myocarditis, though felt no further work up was necessary after echocardiogram was normal. Infectious disease was consulted, and after the patient improved dramatically, recommended 7 total days of doxycycline treatment.   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   Discharge Diagnoses:  NSTEMI ruled out.   Fever, chills: Lab work up reassuring thus far, though uncertain underlying diagnosis/infectious source of constitutional symptoms. - Discussed with ID, Dr. RDelaine Lame Appreciate their evaluation. Will keep on doxycycline for now and plan to follow up serology for Lyme and RMSF.  [facility], IgM positive. RMSF  ruled in.   Headache: Resolved.   Nausea and vomiting: Resolved.   Troponin elevation, demand ischemia, possible viral myocarditis: Cardiology recommends no further workup or intervention. Echocardiogram shows normal biventricular function without wall motion abnormalities, valvular disease, or pericardial fluid.   GERD: - Continue PPI   Depression, anxiety, ADHD: - No changes to current medications   Thrombocytopenia: - Recheck at follow up  Discharge Instructions Discharge Instructions     Call MD for:  difficulty breathing, headache or visual disturbances   Complete by: As directed    Call MD for:  persistant nausea and vomiting   Complete by: As directed    Call MD for:  temperature >100.4   Complete by: As directed    Discharge instructions   Complete by: As directed    - Continue taking doxycycline twice daily for 7 days (sent to your pharmacy).  - Follow up with your PCP to continue treatment for anxiety and for assistance with tobacco cessation.  - Follow up with cardiology in the next 4 weeks due to elevated troponin levels. Seek medical attention right away if you develop chest pain or other troubling symptoms.      Allergies as of 04/25/2021       Reactions   Cherry Anaphylaxis        Medication List     STOP taking these medications    baclofen 10 MG tablet Commonly known as: LIORESAL   calcium carbonate 500 MG chewable tablet Commonly known as: TUMS - dosed in mg elemental calcium   cyclobenzaprine 5 MG tablet Commonly known as: FLEXERIL   pantoprazole 20 MG tablet Commonly known as: Protonix   predniSONE 10 MG tablet Commonly known as: DCuthbertthese  medications    doxycycline 100 MG tablet Commonly known as: VIBRA-TABS Take 1 tablet (100 mg total) by mouth 2 (two) times daily for 7 days.        Follow-up Information     Olin Hauser, DO Follow up.   Specialty: Family Medicine Contact information: [place] [address] [phone]         Corey Skains, MD Follow up in 1 month(s).   Specialty: Cardiology Contact information: [facility] [place] [address] [phone]                Allergies  Allergen Reactions   Cherry Anaphylaxis    Consultations: ID  Procedures/Studies: CT Angio Chest PE W and/or Wo Contrast  Result Date: 04/23/2021 CLINICAL DATA:  Spider bite, fever and chills, headache, nausea and vomiting EXAM: CT ANGIOGRAPHY CHEST WITH CONTRAST TECHNIQUE: Multidetector CT imaging of the chest was performed using the standard protocol during bolus administration of intravenous contrast. Multiplanar CT image reconstructions and MIPs were obtained to evaluate the vascular anatomy. CONTRAST:  20m OMNIPAQUE IOHEXOL 350 MG/ML SOLN COMPARISON:  None. FINDINGS: Cardiovascular: This is a technically adequate evaluation of the pulmonary vasculature. No filling defects or pulmonary emboli. The heart is unremarkable without pericardial effusion. No evidence of thoracic aortic aneurysm or dissection. Mediastinum/Nodes: No enlarged mediastinal, hilar, or axillary lymph nodes. Thyroid gland, trachea, and esophagus demonstrate no significant findings. Lungs/Pleura: No acute airspace disease, effusion, or pneumothorax. There is a 4 mm right middle lobe pulmonary nodule identified, reference image 67/6. 3 mm subpleural left upper lobe pulmonary nodules identified on image 66/6. Central airways are patent. Upper Abdomen: No acute abnormality. Musculoskeletal: No acute or destructive bony lesions. Reconstructed images demonstrate no additional findings. Review of the MIP images confirms the above findings. IMPRESSION: 1. No evidence of pulmonary embolus. 2. Bilateral sub 4 mm pulmonary nodules, almost certainly benign in a patient of this age. No follow-up needed if patient is low-risk (and has no known or suspected primary  neoplasm). Non-contrast chest CT can be considered in 12 months if patient is high-risk. This recommendation follows the consensus statement: Guidelines for Management of Incidental Pulmonary Nodules Detected on CT Images: From the Fleischner Society 2017; Radiology 2017; 284:228-243. Electronically Signed   By: MRanda NgoM.D.   On: 04/23/2021 21:39   ECHOCARDIOGRAM COMPLETE  Result Date: 04/24/2021    ECHOCARDIOGRAM REPORT   Patient Name:   Brian Gordon Date of Exam: 04/24/2021 Medical Rec #:  [id number]    Height:       73.0 in Accession #:    [id number]   Weight:       160.0 lb Date of Birth:  [birth year]     BSA:          1.957 m Patient Age:    29 years      BP:           103/67 mmHg Patient Gender: M             HR:           91 bpm. Exam Location:  [facility] Procedure: 2D Echo, Color Doppler, Cardiac Doppler and Strain Analysis Indications:     Elevated troponin  History:         Patient has no prior history of Echocardiogram examinations.                  Signs/Symptoms:Chest Pain;  Risk Factors:Current Smoker.  Sonographer:     Charmayne Sheer RDCS (AE) Referring Phys:  [id number] Arvella Merles MANSY Diagnosing Phys: Kate Sable MD  Sonographer Comments: Global longitudinal strain was attempted. IMPRESSIONS  1. Left ventricular ejection fraction, by estimation, is 55 to 60%. The left ventricle has normal function. The left ventricle has no regional wall motion abnormalities. Left ventricular diastolic parameters were normal. The average left ventricular global longitudinal strain is -17.1 %. The global longitudinal strain is normal.  2. Right ventricular systolic function is normal. The right ventricular size is normal.  3. The mitral valve is normal in structure. No evidence of mitral valve regurgitation. No evidence of mitral stenosis.  4. The aortic valve is normal in structure. Aortic valve regurgitation is not visualized. No aortic stenosis is present.  5. The inferior vena cava is normal in size with greater  than 50% respiratory variability, suggesting right atrial pressure of 3 mmHg. FINDINGS  Left Ventricle: Left ventricular ejection fraction, by estimation, is 55 to 60%. The left ventricle has normal function. The left ventricle has no regional wall motion abnormalities. The average left ventricular global longitudinal strain is -17.1 %. The global longitudinal strain is normal. The left ventricular internal cavity size was normal in size. There is no left ventricular hypertrophy. Left ventricular diastolic parameters were normal. Right Ventricle: The right ventricular size is normal. No increase in right ventricular wall thickness. Right ventricular systolic function is normal. Left Atrium: Left atrial size was normal in size. Right Atrium: Right atrial size was normal in size. Pericardium: There is no evidence of pericardial effusion. Mitral Valve: The mitral valve is normal in structure. No evidence of mitral valve regurgitation. No evidence of mitral valve stenosis. MV peak gradient, 2.1 mmHg. The mean mitral valve gradient is 1.0 mmHg. Tricuspid Valve: The tricuspid valve is normal in structure. Tricuspid valve regurgitation is not demonstrated. No evidence of tricuspid stenosis. Aortic Valve: The aortic valve is normal in structure. Aortic valve regurgitation is not visualized. No aortic stenosis is present. Aortic valve mean gradient measures 2.0 mmHg. Aortic valve peak gradient measures 3.9 mmHg. Aortic valve area, by VTI measures 4.31 cm. Pulmonic Valve: The pulmonic valve was normal in structure. Pulmonic valve regurgitation is not visualized. No evidence of pulmonic stenosis. Aorta: The aortic root is normal in size and structure. Venous: The inferior vena cava is normal in size with greater than 50% respiratory variability, suggesting right atrial pressure of 3 mmHg. IAS/Shunts: No atrial level shunt detected by color flow Doppler.  LEFT VENTRICLE PLAX 2D LVIDd:         4.30 cm  Diastology LVIDs:          3.00 cm  LV e' medial:    9.14 cm/s LV PW:         1.10 cm  LV E/e' medial:  7.3 LV IVS:        0.80 cm  LV e' lateral:   16.10 cm/s LVOT diam:     2.10 cm  LV E/e' lateral: 4.1 LV SV:         64 LV SV Index:   33       2D Longitudinal Strain LVOT Area:     3.46 cm 2D Strain GLS Avg:     -17.1 %  LEFT ATRIUM             Index LA diam:        3.10 cm 1.58 cm/m LA Vol (A2C):  27.5 ml 14.05 ml/m LA Vol (A4C):   19.3 ml 9.86 ml/m LA Biplane Vol: 23.6 ml 12.06 ml/m  AORTIC VALVE                   PULMONIC VALVE AV Area (Vmax):    3.48 cm    PV Vmax:       0.87 m/s AV Area (Vmean):   3.87 cm    PV Vmean:      58.000 cm/s AV Area (VTI):     4.31 cm    PV VTI:        0.124 m AV Vmax:           98.60 cm/s  PV Peak grad:  3.0 mmHg AV Vmean:          66.300 cm/s PV Mean grad:  2.0 mmHg AV VTI:            0.148 m AV Peak Grad:      3.9 mmHg AV Mean Grad:      2.0 mmHg LVOT Vmax:         99.20 cm/s LVOT Vmean:        74.100 cm/s LVOT VTI:          0.184 m LVOT/AV VTI ratio: 1.24  AORTA Ao Root diam: 2.90 cm MITRAL VALVE MV Area (PHT): 6.32 cm    SHUNTS MV Area VTI:   4.45 cm    Systemic VTI:  0.18 m MV Peak grad:  2.1 mmHg    Systemic Diam: 2.10 cm MV Mean grad:  1.0 mmHg MV Vmax:       0.72 m/s MV Vmean:      50.3 cm/s MV Decel Time: 120 msec MV E velocity: 66.40 cm/s MV A velocity: 62.10 cm/s MV E/A ratio:  1.07 Kate Sable MD Electronically signed by Kate Sable MD Signature Date/Time: 04/24/2021/1:31:53 PM    Final      Subjective: Feels much better, wants to go home. Headache improved, eating ok. No fevers since arrival.   Discharge Exam: Vitals:   04/25/21 0353 04/25/21 0354  BP: 126/83   Pulse: (!) 108   Resp: 14   Temp:  98.7 F (37.1 C)  SpO2: 100%    General: Pt is alert, awake, not in acute distress Cardiovascular: RRR, S1/S2 +, no rubs, no gallops Respiratory: CTA bilaterally, no wheezing, no rhonchi Abdominal: Soft, NT, ND, bowel sounds + Extremities: No edema, no  cyanosis  Labs: BNP (last 3 results) No results for input(s): BNP in the last 8760 hours. Basic Metabolic Panel: Recent Labs  Lab 04/23/21 1937 04/24/21 0614  NA 138 135  K 3.7 4.0  CL 104 102  CO2 25 22  GLUCOSE 105* 110*  BUN 10 12  CREATININE 0.86 0.77  CALCIUM 9.3 8.6*  MG 2.0  --    Liver Function Tests: Recent Labs  Lab 04/23/21 1937  AST 19  ALT 15  ALKPHOS 75  BILITOT 0.7  PROT 8.0  ALBUMIN 4.3   No results for input(s): LIPASE, AMYLASE in the last 168 hours. No results for input(s): AMMONIA in the last 168 hours. CBC: Recent Labs  Lab 04/23/21 1937 04/24/21 0614 04/25/21 0449  WBC 7.2 7.3 6.6  NEUTROABS 4.6  --   --   HGB 16.5 14.0 13.5  HCT 46.4 39.4 37.9*  MCV 89.9 91.0 88.6  PLT 162 148* 145*   Cardiac Enzymes: Recent Labs  Lab 04/24/21 0614  CKTOTAL 68  BNP: Invalid input(s): POCBNP CBG: No results for input(s): GLUCAP in the last 168 hours. D-Dimer No results for input(s): DDIMER in the last 72 hours. Hgb A1c No results for input(s): HGBA1C in the last 72 hours. Lipid Profile Recent Labs    04/24/21 0614  CHOL 116  HDL 27*  LDLCALC 66  TRIG 116  CHOLHDL 4.3   Thyroid function studies No results for input(s): TSH, T4TOTAL, T3FREE, THYROIDAB in the last 72 hours.  Invalid input(s): FREET3 Anemia work up No results for input(s): VITAMINB12, FOLATE, FERRITIN, TIBC, IRON, RETICCTPCT in the last 72 hours. Urinalysis    Component Value Date/Time   COLORURINE YELLOW 02/06/2020 1524   APPEARANCEUR CLEAR 02/06/2020 1524   LABSPEC >1.030 (H) 02/06/2020 1524   PHURINE 5.5 02/06/2020 1524   GLUCOSEU NEGATIVE 02/06/2020 1524   HGBUR NEGATIVE 02/06/2020 1524   BILIRUBINUR SMALL (A) 02/06/2020 1524   KETONESUR 15 (A) 02/06/2020 1524   PROTEINUR NEGATIVE 02/06/2020 1524   NITRITE NEGATIVE 02/06/2020 1524   LEUKOCYTESUR NEGATIVE 02/06/2020 1524    Microbiology Recent Results (from the past 240 hour(s))  Resp Panel by RT-PCR  (Flu A&B, Covid) Nasopharyngeal Swab     Status: None   Collection Time: 04/23/21  7:37 PM   Specimen: Nasopharyngeal Swab; Nasopharyngeal(NP) swabs in vial transport medium  Result Value Ref Range Status   SARS Coronavirus 2 by RT PCR NEGATIVE NEGATIVE Final    Comment: (NOTE) SARS-CoV-2 target nucleic acids are NOT DETECTED.  The SARS-CoV-2 RNA is generally detectable in upper respiratory specimens during the acute phase of infection. The lowest concentration of SARS-CoV-2 viral copies this assay can detect is 138 copies/mL. A negative result does not preclude SARS-Cov-2 infection and should not be used as the sole basis for treatment or other patient management decisions. A negative result may occur with  improper specimen collection/handling, submission of specimen other than nasopharyngeal swab, presence of viral mutation(s) within the areas targeted by this assay, and inadequate number of viral copies(<138 copies/mL). A negative result must be combined with clinical observations, patient history, and epidemiological information. The expected result is Negative.  Fact Sheet for Patients:  EntrepreneurPulse.com.au  Fact Sheet for Healthcare Providers:  IncredibleEmployment.be  This test is no t yet approved or cleared by the [place] FDA and  has been authorized for detection and/or diagnosis of SARS-CoV-2 by FDA under an Emergency Use Authorization (EUA). This EUA will remain  in effect (meaning this test can be used) for the duration of the COVID-19 declaration under Section 564(b)(1) of the Act, 21 U.S.C.section 360bbb-3(b)(1), unless the authorization is terminated  or revoked sooner.       Influenza A by PCR NEGATIVE NEGATIVE Final   Influenza B by PCR NEGATIVE NEGATIVE Final    Comment: (NOTE) The Xpert Xpress SARS-CoV-2/FLU/RSV plus assay is intended as an aid in the diagnosis of influenza from Nasopharyngeal swab specimens  and should not be used as a sole basis for treatment. Nasal washings and aspirates are unacceptable for Xpert Xpress SARS-CoV-2/FLU/RSV testing.  Fact Sheet for Patients: EntrepreneurPulse.com.au  Fact Sheet for Healthcare Providers: IncredibleEmployment.be  This test is not yet approved or cleared by the [place] FDA and has been authorized for detection and/or diagnosis of SARS-CoV-2 by FDA under an Emergency Use Authorization (EUA). This EUA will remain in effect (meaning this test can be used) for the duration of the COVID-19 declaration under Section 564(b)(1) of the Act, 21 U.S.C. section 360bbb-3(b)(1), unless the authorization is terminated or  revoked.  Performed at [facility], [place], [place] [address]   Respiratory (~20 pathogens) panel by PCR     Status: None   Collection Time: 04/24/21  4:45 PM   Specimen: Nasopharyngeal Swab; Respiratory  Result Value Ref Range Status   Adenovirus NOT DETECTED NOT DETECTED Final   Coronavirus 229E NOT DETECTED NOT DETECTED Final    Comment: (NOTE) The Coronavirus on the Respiratory Panel, DOES NOT test for the novel  Coronavirus (2019 nCoV)    Coronavirus HKU1 NOT DETECTED NOT DETECTED Final   Coronavirus NL63 NOT DETECTED NOT DETECTED Final   Coronavirus OC43 NOT DETECTED NOT DETECTED Final   Metapneumovirus NOT DETECTED NOT DETECTED Final   Rhinovirus / Enterovirus NOT DETECTED NOT DETECTED Final   Influenza A NOT DETECTED NOT DETECTED Final   Influenza B NOT DETECTED NOT DETECTED Final   Parainfluenza Virus 1 NOT DETECTED NOT DETECTED Final   Parainfluenza Virus 2 NOT DETECTED NOT DETECTED Final   Parainfluenza Virus 3 NOT DETECTED NOT DETECTED Final   Parainfluenza Virus 4 NOT DETECTED NOT DETECTED Final   Respiratory Syncytial Virus NOT DETECTED NOT DETECTED Final   Bordetella pertussis NOT DETECTED NOT DETECTED Final   Bordetella Parapertussis NOT  DETECTED NOT DETECTED Final   Chlamydophila pneumoniae NOT DETECTED NOT DETECTED Final   Mycoplasma pneumoniae NOT DETECTED NOT DETECTED Final    Comment: Performed at [facility], [place]. [address] [place], [place] [address]    Time coordinating discharge: Approximately 40 minutes  Patrecia Pour, MD  [facility] 04/25/2021, 9:35 AM

## 2021-04-26 LAB — HSV(HERPES SIMPLEX VRS) I + II AB-IGM: HSVI/II Comb IgM: 1.26 Ratio — ABNORMAL HIGH (ref 0.00–0.90)

## 2021-04-27 LAB — ROCKY MTN SPOTTED FVR ABS PNL(IGG+IGM)
RMSF IgG: NEGATIVE
RMSF IgM: 1.58 index — ABNORMAL HIGH (ref 0.00–0.89)

## 2021-04-30 ENCOUNTER — Inpatient Hospital Stay: Payer: Self-pay | Admitting: Family Medicine

## 2021-05-01 ENCOUNTER — Emergency Department
Admission: EM | Admit: 2021-05-01 | Discharge: 2021-05-01 | Disposition: A | Discharge status: Home / Self Care | Payer: Self-pay | Attending: Student in an Organized Health Care Education/Training Program | Admitting: Student in an Organized Health Care Education/Training Program

## 2021-05-01 ENCOUNTER — Other Ambulatory Visit: Payer: Self-pay

## 2021-05-01 ENCOUNTER — Emergency Department: Payer: Self-pay

## 2021-05-01 DIAGNOSIS — R0789 Other chest pain: Secondary | ICD-10-CM | POA: Insufficient documentation

## 2021-05-01 DIAGNOSIS — F17210 Nicotine dependence, cigarettes, uncomplicated: Secondary | ICD-10-CM | POA: Insufficient documentation

## 2021-05-01 DIAGNOSIS — R079 Chest pain, unspecified: Secondary | ICD-10-CM

## 2021-05-01 LAB — CBC
HCT: 42.4 % (ref 39.0–52.0)
Hemoglobin: 14.9 g/dL (ref 13.0–17.0)
MCH: 30.7 pg (ref 26.0–34.0)
MCHC: 35.1 g/dL (ref 30.0–36.0)
MCV: 87.2 fL (ref 80.0–100.0)
Platelets: 263 10*3/uL (ref 150–400)
RBC: 4.86 MIL/uL (ref 4.22–5.81)
RDW: 11.9 % (ref 11.5–15.5)
WBC: 6.9 10*3/uL (ref 4.0–10.5)
nRBC: 0 % (ref 0.0–0.2)

## 2021-05-01 LAB — COMPREHENSIVE METABOLIC PANEL
ALT: 28 U/L (ref 0–44)
AST: 17 U/L (ref 15–41)
Albumin: 3.6 g/dL (ref 3.5–5.0)
Alkaline Phosphatase: 74 U/L (ref 38–126)
Anion gap: 10 (ref 5–15)
BUN: 15 mg/dL (ref 6–20)
CO2: 26 mmol/L (ref 22–32)
Calcium: 8.9 mg/dL (ref 8.9–10.3)
Chloride: 102 mmol/L (ref 98–111)
Creatinine, Ser: 0.79 mg/dL (ref 0.61–1.24)
GFR, Estimated: >60 mL/min (ref >60)
Glucose, Bld: 113 mg/dL — ABNORMAL HIGH (ref 70–99)
Potassium: 4.5 mmol/L (ref 3.5–5.1)
Sodium: 138 mmol/L (ref 135–145)
Total Bilirubin: 0.7 mg/dL (ref 0.3–1.2)
Total Protein: 7.3 g/dL (ref 6.5–8.1)

## 2021-05-01 LAB — TROPONIN I (HIGH SENSITIVITY): Troponin I (High Sensitivity): 10 ng/L (ref <18)

## 2021-05-01 MED ORDER — SODIUM CHLORIDE 0.9 % IV BOLUS
1000.0000 mL | Freq: Once | INTRAVENOUS | Status: AC
  Administered 2021-05-01: 1000 mL via INTRAVENOUS

## 2021-05-01 MED ORDER — KETOROLAC TROMETHAMINE 30 MG/ML IJ SOLN
15.0000 mg | Freq: Once | INTRAMUSCULAR | Status: AC
  Administered 2021-05-01: 15 mg via INTRAVENOUS
  Filled 2021-05-01: qty 1

## 2021-05-01 MED ORDER — PANTOPRAZOLE SODIUM 40 MG PO TBEC: 40.0000 mg | DELAYED_RELEASE_TABLET | Freq: Every day | ORAL | 1 refills | Status: AC

## 2021-05-01 MED ORDER — ONDANSETRON HCL 4 MG/2ML IJ SOLN
4.0000 mg | Freq: Once | INTRAMUSCULAR | Status: AC
  Administered 2021-05-01: 4 mg via INTRAVENOUS
  Filled 2021-05-01: qty 2

## 2021-05-01 MED ORDER — MORPHINE SULFATE (PF) 4 MG/ML IV SOLN
4.0000 mg | INTRAVENOUS | Status: DC | PRN
Start: 2021-05-01 — End: 2021-05-01
  Administered 2021-05-01: 4 mg via INTRAVENOUS
  Filled 2021-05-01: qty 1

## 2021-05-01 MED ORDER — ONDANSETRON 4 MG PO TBDP: 4.0000 mg | ORAL_TABLET | Freq: Three times a day (TID) | ORAL | 0 refills | Status: DC | PRN

## 2021-05-01 NOTE — ED Triage Notes (Signed)
Pt comes pov for chest pain after being admitted last week for possible brown recluse bite. Day 2 of hospital stay pt states that CP started and hasn't gotten better. Trending trops up per pt. States "deep hole" feeling in chest when breathing in. Ambulatory to room, Aox4.

## 2021-05-01 NOTE — ED Provider Notes (Signed)
[facility] Provider Note    Event Date/Time   First MD Initiated Contact with Patient 05/01/21 0720     (approximate)  I have reviewed the triage vital signs and the nursing notes.   HISTORY  Chief Complaint Chest Pain    HPI Brian Gordon is a 30 y.o. male below listed past medical history presents to the ER for evaluation midsternal nonradiating chest pain that he feels like is a "hole in his chest."  Denies any fevers.  Has been taking doxycycline as prescribed.  Feels significantly improved.Not having issues with nausea or vomiting or indigestion.  Denies any abdominal pain.  No new rashes.  States pain and discomfort has been present and unchanged since just recently being admitted to hospital.  Had elevated troponins during that hospitalization but had reassuring work-up.  States that he just became nervous this morning think about persistent chest discomfort with the recent elevated enzymes.  Had CTA that showed no evidence of PE.  Denies any numbness or tingling.  Past Medical History:  Diagnosis Date   Allergy    Anxiety    Concussion    GERD (gastroesophageal reflux disease)    Family History  Problem Relation Age of Onset   Depression Mother    Depression Sister    Anxiety disorder Sister    History reviewed. No pertinent surgical history. Patient Active Problem List   Diagnosis Date Noted   NSTEMI (non-ST elevated myocardial infarction) (HCC) 04/23/2021   Chronic bilateral low back pain without sciatica 03/07/2020   Major depressive disorder, recurrent, moderate (HCC) 03/07/2020   GAD (generalized anxiety disorder) 03/07/2020   Gastroesophageal reflux disease without esophagitis 03/07/2020      Prior to Admission medications   Medication Sig Start Date End Date Taking? Authorizing Provider  ondansetron (ZOFRAN ODT) 4 MG disintegrating tablet Take 1 tablet (4 mg total) by mouth every 8 (eight) hours as needed  for nausea or vomiting. 05/01/21  Yes Willy Eddy, MD  pantoprazole (PROTONIX) 40 MG tablet Take 1 tablet (40 mg total) by mouth daily. 05/01/21 05/01/22 Yes Willy Eddy, MD  doxycycline (VIBRA-TABS) 100 MG tablet Take 1 tablet (100 mg total) by mouth 2 (two) times daily for 7 days. 04/25/21 05/02/21  Tyrone Nine, MD  promethazine (PHENERGAN) 12.5 MG tablet Take 1 tablet (12.5 mg total) by mouth every 6 (six) hours as needed for nausea or vomiting. Patient not taking: Reported on 04/17/2018 10/13/17 03/03/20  Willy Eddy, MD    Allergies Valentino Saxon    Social History Social History   Tobacco Use   Smoking status: Every Day    Packs/day: 0.50    Years: 10.00    Pack years: 5.00    Types: Cigarettes   Smokeless tobacco: Never  Vaping Use   Vaping Use: Never used  Substance Use Topics   Alcohol use: Not Currently    Comment: quit 3-4 years ago, approx 2018   Drug use: Not Currently    Types: Marijuana    Comment: CBD OTC    Review of Systems Patient denies headaches, rhinorrhea, blurry vision, numbness, shortness of breath, chest pain, edema, cough, abdominal pain, nausea, vomiting, diarrhea, dysuria, fevers, rashes or hallucinations unless otherwise stated above in HPI. ____________________________________________   PHYSICAL EXAM:  VITAL SIGNS: Vitals:   05/01/21 0830 05/01/21 0900  BP: 115/77 124/77  Pulse: 88 91  Resp:  16  Temp:    SpO2: 100% 100%    Constitutional: Alert and  oriented.  Eyes: Conjunctivae are normal.  Head: Atraumatic. Nose: No congestion/rhinnorhea. Mouth/Throat: Mucous membranes are moist.   Neck: No stridor. Painless ROM.  Cardiovascular: Normal rate, regular rhythm. Grossly normal heart sounds.  Good peripheral circulation. Respiratory: Normal respiratory effort.  No retractions. Lungs CTAB. Gastrointestinal: Soft and nontender. No distention. No abdominal bruits. No CVA tenderness. Genitourinary:  Musculoskeletal: No lower  extremity tenderness nor edema.  No joint effusions. Neurologic:  Normal speech and language. No gross focal neurologic deficits are appreciated. No facial droop Skin:  Skin is warm, dry and intact. No rash noted. Psychiatric: Mood and affect are normal. Speech and behavior are normal.  ____________________________________________   LABS (all labs ordered are listed, but only abnormal results are displayed)  Results for orders placed or performed during the hospital encounter of 05/01/21 (from the past 24 hour(s))  Troponin I (High Sensitivity)     Status: None   Collection Time: 05/01/21  7:54 AM  Result Value Ref Range   Troponin I (High Sensitivity) 10 <18 ng/L  CBC     Status: None   Collection Time: 05/01/21  7:54 AM  Result Value Ref Range   WBC 6.9 4.0 - 10.5 K/uL   RBC 4.86 4.22 - 5.81 MIL/uL   Hemoglobin 14.9 13.0 - 17.0 g/dL   HCT 16.1 09.6 - 04.5 %   MCV 87.2 80.0 - 100.0 fL   MCH 30.7 26.0 - 34.0 pg   MCHC 35.1 30.0 - 36.0 g/dL   RDW 40.9 81.1 - 91.4 %   Platelets 263 150 - 400 K/uL   nRBC 0.0 0.0 - 0.2 %  Comprehensive metabolic panel     Status: Abnormal   Collection Time: 05/01/21  7:54 AM  Result Value Ref Range   Sodium 138 135 - 145 mmol/L   Potassium 4.5 3.5 - 5.1 mmol/L   Chloride 102 98 - 111 mmol/L   CO2 26 22 - 32 mmol/L   Glucose, Bld 113 (H) 70 - 99 mg/dL   BUN 15 6 - 20 mg/dL   Creatinine, Ser 7.82 0.61 - 1.24 mg/dL   Calcium 8.9 8.9 - 95.6 mg/dL   Total Protein 7.3 6.5 - 8.1 g/dL   Albumin 3.6 3.5 - 5.0 g/dL   AST 17 15 - 41 U/L   ALT 28 0 - 44 U/L   Alkaline Phosphatase 74 38 - 126 U/L   Total Bilirubin 0.7 0.3 - 1.2 mg/dL   GFR, Estimated >21 >30 mL/min   Anion gap 10 5 - 15   ____________________________________________  EKG My review and personal interpretation at Time: 7:31   Indication: chest pain  Rate: 110  Rhythm: sinus Axis: normal Other: normal intervals, no stemi ____________________________________________  RADIOLOGY  I  personally reviewed all radiographic images ordered to evaluate for the above acute complaints and reviewed radiology reports and findings.  These findings were personally discussed with the patient.  Please see medical record for radiology report.  ____________________________________________   PROCEDURES  Procedure(s) performed:  Procedures    Critical Care performed: no ____________________________________________   INITIAL IMPRESSION / ASSESSMENT AND PLAN / ED COURSE  Pertinent labs & imaging results that were available during my care of the patient were reviewed by me and considered in my medical decision making (see chart for details).   DDX: ACS, pericarditis, esophagitis, pe, dissection, pna, bronchitis, costochondritis   Brian Guzy is a 30 y.o. who presents to the ED with presentation as described above.  Patient clinically well-appearing  mildly tachycardic upon arrival but no hypoxia.  Pain seems positional does have a history of reflux that he feels may be causing some of his symptoms particular in the setting of taking doxycycline.  His EKG is nonischemic troponin is negative.  Given persistent symptoms since hospitalization I do not think this is consistent with ACS.  No clear EKG findings to suggest pericarditis.  May have some pleurisy.  Seems to have most of his symptoms have significantly improved after doxycycline raising question for staph or tickborne illness but he is not showing signs of sepsis at this time.  Given reassuring exam work-up I do believe he is appropriate for continued outpatient follow-up.  I do not believe that repeat CT imaging clinically indicated with this presentation.  Patient agreeable to plan.     The patient was evaluated in Emergency Department today for the symptoms described in the history of present illness. He/she was evaluated in the context of the global COVID-19 pandemic, which necessitated consideration that the patient might be at  risk for infection with the SARS-CoV-2 virus that causes COVID-19. Institutional protocols and algorithms that pertain to the evaluation of patients at risk for COVID-19 are in a state of rapid change based on information released by regulatory bodies including the CDC and federal and state organizations. These policies and algorithms were followed during the patient's care in the ED.  As part of my medical decision making, I reviewed the following data within the electronic MEDICAL RECORD NUMBER Nursing notes reviewed and incorporated, Labs reviewed, notes from prior ED visits and [place] Controlled Substance Database   ____________________________________________   FINAL CLINICAL IMPRESSION(S) / ED DIAGNOSES  Final diagnoses:  Chest pain, unspecified type      NEW MEDICATIONS STARTED DURING THIS VISIT:  New Prescriptions   ONDANSETRON (ZOFRAN ODT) 4 MG DISINTEGRATING TABLET    Take 1 tablet (4 mg total) by mouth every 8 (eight) hours as needed for nausea or vomiting.   PANTOPRAZOLE (PROTONIX) 40 MG TABLET    Take 1 tablet (40 mg total) by mouth daily.     Note:  This document was prepared using Dragon voice recognition software and may include unintentional dictation errors. [phone]   Willy Eddy, MD 05/01/21 [phone]

## 2021-10-10 IMAGING — DX DG CHEST 1V PORT
1 series · 1 of 1 positions shown · non-contrast
Comparison: CT 04/23/2021.  06/18/2019.

CLINICAL DATA: Chest pain.

EXAM:
PORTABLE CHEST 1 VIEW

[chest ap]
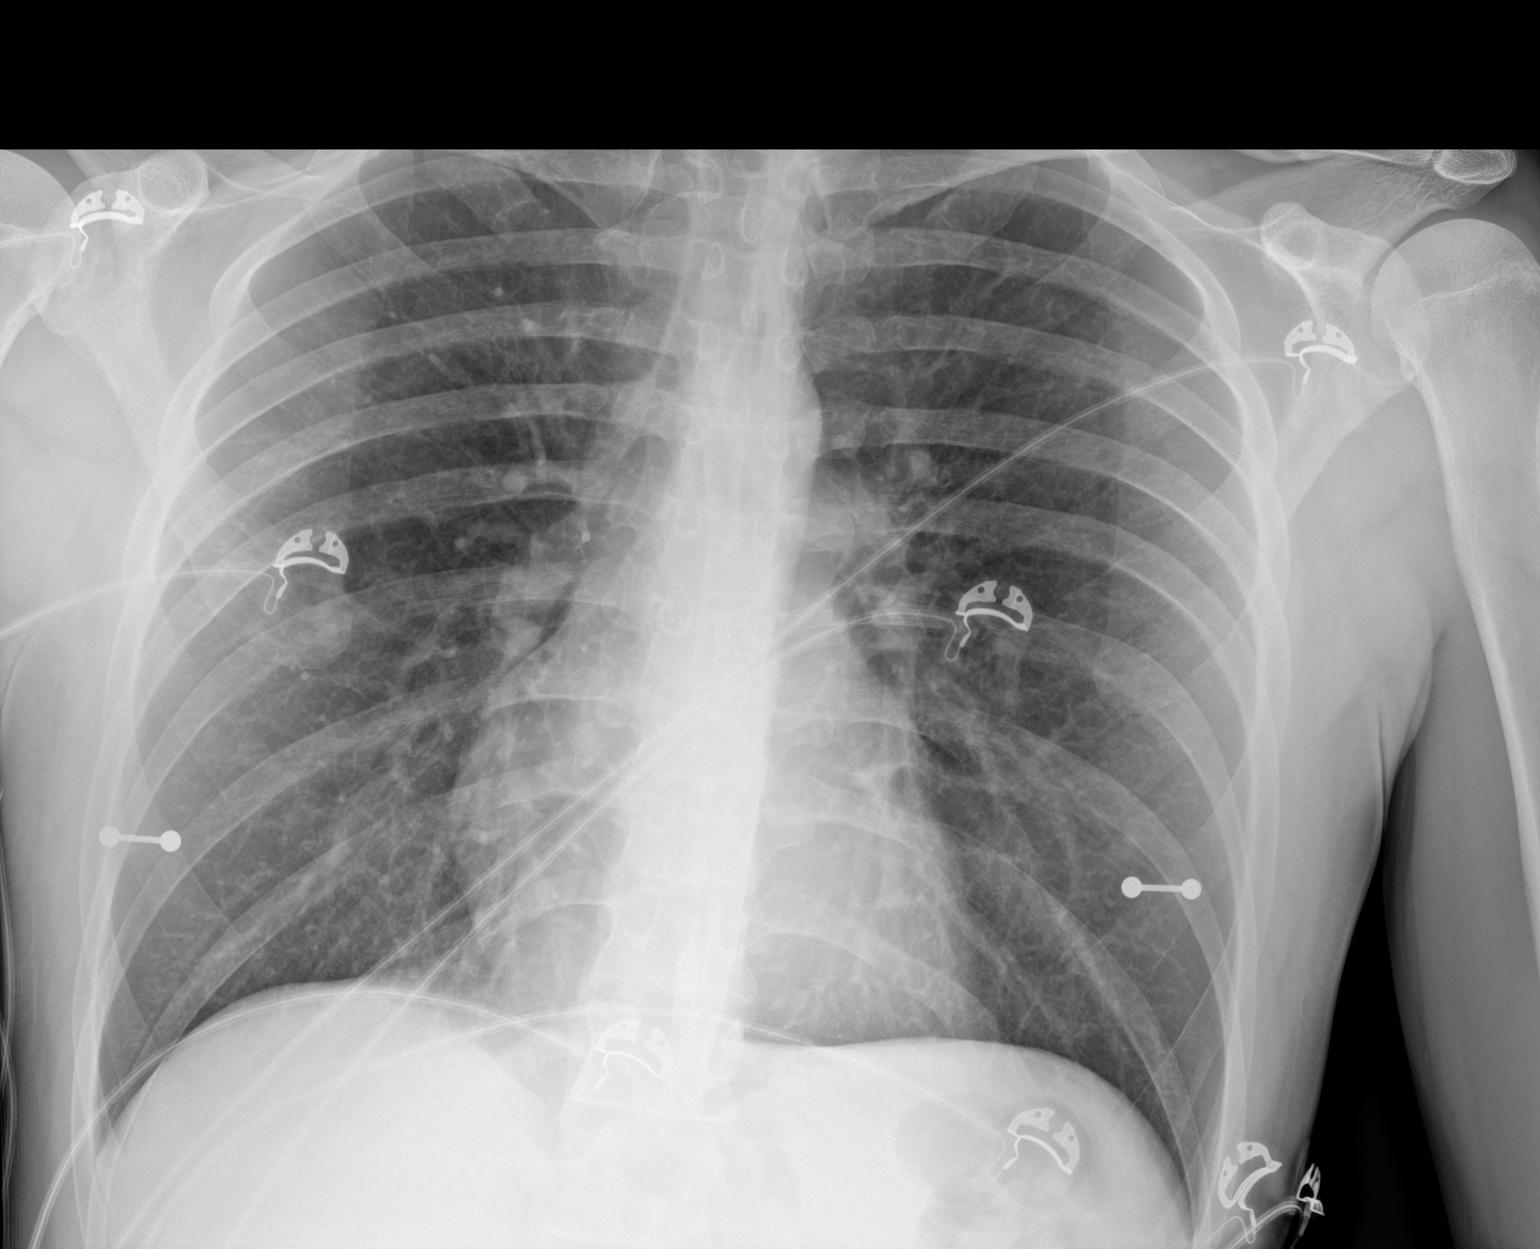

[1 of 1 positions shown; findings below may reference images not displayed]

FINDINGS: Mediastinum hilar structures normal. Heart size normal. Lungs are
clear. Reference is made to chest CT report for discussion of tiny
pulmonary nodules present. No pleural effusion or pneumothorax.
IMPRESSION: No acute cardiopulmonary disease.

## 2022-10-12 ENCOUNTER — Other Ambulatory Visit: Payer: Self-pay

## 2022-10-12 ENCOUNTER — Emergency Department
Admission: EM | Admit: 2022-10-12 | Discharge: 2022-10-12 | Disposition: A | Discharge status: Home / Self Care | Payer: Self-pay | Attending: Emergency Medicine | Admitting: Emergency Medicine

## 2022-10-12 ENCOUNTER — Emergency Department: Payer: Self-pay

## 2022-10-12 DIAGNOSIS — Z20822 Contact with and (suspected) exposure to covid-19: Secondary | ICD-10-CM | POA: Insufficient documentation

## 2022-10-12 DIAGNOSIS — J101 Influenza due to other identified influenza virus with other respiratory manifestations: Secondary | ICD-10-CM | POA: Insufficient documentation

## 2022-10-12 LAB — RESP PANEL BY RT-PCR (RSV, FLU A&B, COVID)  RVPGX2
Influenza A by PCR: POSITIVE — AB
Influenza B by PCR: NEGATIVE
Resp Syncytial Virus by PCR: NEGATIVE
SARS Coronavirus 2 by RT PCR: NEGATIVE

## 2022-10-12 MED ORDER — IBUPROFEN 400 MG PO TABS
400.0000 mg | ORAL_TABLET | Freq: Once | ORAL | Status: AC
  Administered 2022-10-12: 400 mg via ORAL
  Filled 2022-10-12: qty 1

## 2022-10-12 MED ORDER — IPRATROPIUM-ALBUTEROL 0.5-2.5 (3) MG/3ML IN SOLN
3.0000 mL | Freq: Once | RESPIRATORY_TRACT | Status: AC
  Administered 2022-10-12: 3 mL via RESPIRATORY_TRACT
  Filled 2022-10-12: qty 3

## 2022-10-12 NOTE — ED Provider Notes (Signed)
   [facility] Provider Note    Event Date/Time   First MD Initiated Contact with Patient 10/12/22 1830     (approximate)   History   Generalized Body Aches, Cough, and Nasal Congestion   HPI  Brian Gordon is a 32 y.o. male who presents with complaints of upper respiratory infection for for 5 days,  complains of cough as well.  No shortness of breath.     Physical Exam   Triage Vital Signs: ED Triage Vitals  Enc Vitals Group     BP 10/12/22 1823 (!) 126/90     Pulse Rate 10/12/22 1823 98     Resp 10/12/22 1823 18     Temp 10/12/22 1823 98.4 F (36.9 C)     Temp Source 10/12/22 1823 Oral     SpO2 10/12/22 1823 96 %     Weight 10/12/22 1821 76 kg (167 lb 8.8 oz)     Height 10/12/22 1821 1.88 m (6\' 2" )     Head Circumference --      Peak Flow --      Pain Score 10/12/22 1821 4     Pain Loc --      Pain Edu? --      Excl. in [place]? --     Most recent vital signs: Vitals:   10/12/22 1823  BP: (!) 126/90  Pulse: 98  Resp: 18  Temp: 98.4 F (36.9 C)  SpO2: 96%     General: Awake, no distress.  CV:  Good peripheral perfusion.  Resp:  Normal effort.  Scattered mild wheezes Abd:  No distention.  Other:     ED Results / Procedures / Treatments   Labs (all labs ordered are listed, but only abnormal results are displayed) Labs Reviewed  RESP PANEL BY RT-PCR (RSV, FLU A&B, COVID)  RVPGX2 - Abnormal; Notable for the following components:      Result Value   Influenza A by PCR POSITIVE (*)    All other components within normal limits     EKG     RADIOLOGY Chest x-ray viewed interpreted by me, no pneumonia    PROCEDURES:  Critical Care performed:   Procedures   MEDICATIONS ORDERED IN ED: Medications  ibuprofen (ADVIL) tablet 400 mg (has no administration in time range)  ipratropium-albuterol (DUONEB) 0.5-2.5 (3) MG/3ML nebulizer solution 3 mL (has no administration in time range)     IMPRESSION / MDM / ASSESSMENT AND  PLAN / ED COURSE  I reviewed the triage vital signs and the nursing notes. Patient's presentation is most consistent with acute illness / injury with system symptoms.  Patient presents with cough, upper respiratory symptoms as above.  Differential includes influenza, COVID, pneumonia  Overall well-appearing in no acute distress, scattered wheezes on exam suspicious for bronchospasm, will treat with DuoNeb, respiratory panel pending  Chest x-ray negative for pneumonia  Recommend supportive care, outpatient follow-up as needed        FINAL CLINICAL IMPRESSION(S) / ED DIAGNOSES   Final diagnoses:  Influenza A     Rx / DC Orders   ED Discharge Orders     None        Note:  This document was prepared using Dragon voice recognition software and may include unintentional dictation errors.   Lavonia Drafts, MD 10/12/22 [phone]

## 2022-10-12 NOTE — ED Triage Notes (Signed)
Pt reports flu like sx's for several days. Pt reports was exposed to a friend that had walking pneumonia. Pt reports bodyaches, fever, congestion, cough and vomiting

## 2024-07-15 ENCOUNTER — Ambulatory Visit (INDEPENDENT_AMBULATORY_CARE_PROVIDER_SITE_OTHER)

## 2024-07-15 ENCOUNTER — Ambulatory Visit: Payer: Self-pay

## 2024-07-15 ENCOUNTER — Telehealth: Payer: Self-pay | Admitting: Emergency Medicine

## 2024-07-15 ENCOUNTER — Encounter: Payer: Self-pay | Admitting: Emergency Medicine

## 2024-07-15 ENCOUNTER — Ambulatory Visit
Admission: EM | Admit: 2024-07-15 | Discharge: 2024-07-15 | Disposition: A | Discharge status: Home / Self Care | Attending: Emergency Medicine | Admitting: Emergency Medicine

## 2024-07-15 DIAGNOSIS — M25511 Pain in right shoulder: Secondary | ICD-10-CM | POA: Diagnosis not present

## 2024-07-15 DIAGNOSIS — M542 Cervicalgia: Secondary | ICD-10-CM

## 2024-07-15 DIAGNOSIS — M79671 Pain in right foot: Secondary | ICD-10-CM

## 2024-07-15 MED ORDER — KETOROLAC TROMETHAMINE 30 MG/ML IJ SOLN
30.0000 mg | Freq: Once | INTRAMUSCULAR | Status: AC
  Administered 2024-07-15: 30 mg via INTRAMUSCULAR

## 2024-07-15 NOTE — Telephone Encounter (Signed)
 FYI Only or Action Required?: FYI only for provider.  Patient was last seen in primary care on N/A, new patient.  Called Nurse Triage reporting Shoulder Pain and Foot Pain.  Symptoms began several days ago.  Interventions attempted: OTC medications: Tiger Balm lidocaine  patches, Rest, hydration, or home remedies, and Other: foot stretches.  Symptoms are: right foot pain constant; right shoulder pain constant, right knuckles intermittent numbness/tingling gradually worsening.  Triage Disposition: See Physician Within 24 Hours  Patient/caregiver understands and will follow disposition?: Yes           Copied from CRM [id number]. Topic: Clinical - Red Word Triage >> Jul 15, 2024 11:36 AM Donna BRAVO wrote: Red Word that prompted transfer to Nurse Triage: patients right shoulder pain when lifting above shoulder, and 3 to 5 days pain has been constant.  Constant pain in right foot Reason for Disposition  Numbness (i.e., loss of sensation) in hand or fingers  Answer Assessment - Initial Assessment Questions 1. ONSET: When did the pain start?     4-5 days ago. Constant dull pain.  2. LOCATION: Where is the pain located?     Right.  3. PAIN: How bad is the pain? (Scale 1-10; or mild, moderate, severe)     2-3/10 at rest. 6/10 at it's worst. Lifting right arm above head, lifting any weight makes the pain worse. He states he is still able to do daily/normal activities.  4. WORK OR EXERCISE: Has there been any recent work or exercise that involved this part of the body?     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  5. CAUSE: What do you think is causing the shoulder pain?     He states a couple months ago he was throwing a tennis ball with a lacrosse stick and he injured his right elbow/shoulder. He states he gets joint paint frequently in knees and hips and is wondering if it is arthritis.  6. OTHER SYMPTOMS: Do you have any other symptoms? (e.g.,  neck pain, swelling, rash, fever, numbness, weakness)     Constant right foot pain 6-7/10 when walking x 5 days (limping today, pain worsened today). Intermittent numbness/tingling in right hand knuckles. Denies weakness, swelling, fever.  7. PREGNANCY: Is there any chance you are pregnant? When was your last menstrual period?     N/A.  Protocols used: Shoulder Pain-A-AH

## 2024-07-15 NOTE — Telephone Encounter (Signed)
 X-ray reported to patient via telephone, 2 identifiers used, no change in treatment

## 2024-07-15 NOTE — Discharge Instructions (Signed)
 Today you are evaluated for your foot and shoulder pain  On exam you have tenderness along the right side of your neck as well as in the shoulder blade, the neck pain is most likely your true source of injury and irritation as pain to this area can cause radiating pain into the shoulder blade, shoulder and the arm, pain in the neck and shoulder can cause nerve compression which is why you are experiencing numbness and tingling in your fingers  X-ray of your foot has been completed as the foot is a delicate area to ensure that you do not have a stress fracture, you will be notified of these results by phone  You have been given an injection of Toradol  which helps to reduce inflammation and helps with pain and ideally will start see some relief within the next 30 minutes to an hour  At home begin use of ibuprofen  taking 600 to 800 mg (3 to 4 tablets) every 6-8 hours consistently for the next 5 days to continue the process above, may take Tylenol  or using topical medicine as needed  May apply ice or heat over the affected area in 10 to 15-minute intervals  Recommend daily massaging and stretching of the affected areas to further loosen the muscle, exercises inside of packet  May continue to use compression over the foot  May elevate the foot whenever sitting and lying  May cushion the body with pillows for general comfort and support  If your symptoms continue to persist may follow-up with urgent care or orthopedic specialist as needed, information sent and sent to

## 2024-07-15 NOTE — ED Triage Notes (Signed)
 Patient reports pain in right shoulder to right foot x 1 week. Patient has not taken anything for symptoms. Rates pain in right shoulder 3/10 and Rates pain in right foot 5/10. Denies injury.

## 2024-07-16 NOTE — ED Provider Notes (Signed)
 CAY RALPH PELT    CSN: [id number] Arrival date & time: 07/15/24  1557      History   Chief Complaint Chief Complaint  Patient presents with   Shoulder Pain   Foot Pain    HPI [place] Brock is a 33 y.o. male.   Patient presents for evaluation of worsening right shoulder and right foot pain beginning 4 days ago.  Endorses he has had various dull aching pain throughout the body typically affecting the shoulder, back, foot and hip, unsure of cause as he does not recall direct injury or trauma.  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  Pain to the shoulder is exacerbated by lifting the arm towards the head, pain radiates down the arm and he experiences numbness to the 1st and 2nd finger.  Foot pain is exacerbated by long periods of walking and standing, denies numbness or tingling at this area and is able to bear weight.  Has not really attempted treatment.  Past Medical History:  Diagnosis Date   Allergy    Anxiety    Concussion    GERD (gastroesophageal reflux disease)     Patient Active Problem List   Diagnosis Date Noted   NSTEMI (non-ST elevated myocardial infarction) (HCC) 04/23/2021   Chronic bilateral low back pain without sciatica 03/07/2020   Major depressive disorder, recurrent, moderate (HCC) 03/07/2020   GAD (generalized anxiety disorder) 03/07/2020   Gastroesophageal reflux disease without esophagitis 03/07/2020    History reviewed. No pertinent surgical history.     Home Medications    Prior to Admission medications   Medication Sig Start Date End Date Taking? Authorizing Provider  ondansetron  (ZOFRAN  ODT) 4 MG disintegrating tablet Take 1 tablet (4 mg total) by mouth every 8 (eight) hours as needed for nausea or vomiting. 05/01/21   Lang Dover, MD  pantoprazole  (PROTONIX ) 40 MG tablet Take 1 tablet (40 mg total) by mouth daily. 05/01/21 05/01/22  Lang Dover, MD  promethazine  (PHENERGAN ) 12.5 MG tablet Take 1 tablet (12.5 mg total) by mouth every 6 (six) hours as needed for nausea or vomiting. Patient not taking: Reported on 04/17/2018 10/13/17 03/03/20  Lang Dover, MD    Family History Family History  Problem Relation Age of Onset   Depression Mother    Depression Sister    Anxiety disorder Sister     Social History Social History   Tobacco Use   Smoking status: Every Day    Current packs/day: 0.50    Average packs/day: 0.5 packs/day for 10.0 years (5.0 ttl pk-yrs)    Types: Cigarettes   Smokeless tobacco: Never  Vaping Use   Vaping status: Never Used  Substance Use Topics   Alcohol use: Not Currently    Comment: quit 3-4 years ago, approx 2018   Drug use: Not Currently    Types: Marijuana    Comment: CBD OTC     Allergies   Cherry   Review of Systems Review of Systems   Physical Exam Triage Vital Signs ED Triage Vitals  Encounter Vitals Group     BP 07/15/24 1610 126/85     Girls Systolic BP Percentile --      Girls Diastolic BP Percentile --      Boys Systolic BP Percentile --      Boys Diastolic BP Percentile --      Pulse Rate 07/15/24 1610 (!) 101  Resp 07/15/24 1610 18     Temp 07/15/24 1610 98.1 F (36.7 C)     Temp Source 07/15/24 1610 Oral     SpO2 07/15/24 1610 98 %     Weight --      Height --      Head Circumference --      Peak Flow --      Pain Score 07/15/24 1606 3     Pain Loc --      Pain Education --      Exclude from Growth Chart --    No data found.  Updated Vital Signs BP 126/85 (BP Location: Left Arm)   Pulse (!) 101   Temp 98.1 F (36.7 C) (Oral)   Resp 18   SpO2 98%   Visual Acuity Right Eye Distance:   Left Eye Distance:   Bilateral Distance:    Right Eye Near:   Left Eye Near:    Bilateral Near:     Physical Exam Constitutional:      Appearance: Normal appearance.  Eyes:     Extraocular Movements: Extraocular movements intact.  Neck:      Comments: Tenderness present to the posterior of the right lateral aspect of the neck without point tenderness, ecchymosis swelling or deformity, tenderness extends into the right shoulder blade without ecchymosis swelling or deformity, able to complete range of motion but pain is elicited with right lateral turn and abduction of the right arm, no rigidity or crepitus noted, negative Hawking sign, no AC joint tenderness, 2+ carotid and brachial pulse, strength is a 4 out of 5 Pulmonary:     Effort: Pulmonary effort is normal.  Musculoskeletal:     Comments: Tenderness present to the dorsum and lateral aspect of the right midfoot without point tenderness, mild swelling over the right lateral malleolus, no deformity or ecchymosis, able to bear weight and complete full range of motion, 2+ pedal pulse,  Neurological:     Mental Status: He is alert and oriented to person, place, and time.      UC Treatments / Results  Labs (all labs ordered are listed, but only abnormal results are displayed) Labs Reviewed - No data to display  EKG   Radiology DG Foot Complete Right Result Date: 07/15/2024 CLINICAL DATA:  Right foot pain for 5 days. EXAM: RIGHT FOOT COMPLETE - 3+ VIEW COMPARISON:  None Available. FINDINGS: No acute fracture or dislocation. No aggressive osseous lesion. No significant arthritis of imaged joints. No focal soft tissue swelling. No radiopaque foreign bodies. IMPRESSION: No acute osseous abnormality of the right foot. Electronically Signed   By: Ree Molt M.D.   On: 07/15/2024 17:24    Procedures Procedures (including critical care time)  Medications Ordered in UC Medications  ketorolac  (TORADOL ) 30 MG/ML injection 30 mg (30 mg Intramuscular Given 07/15/24 1641)    Initial Impression / Assessment and Plan / UC Course  I have reviewed the triage vital signs and the nursing notes.  Pertinent labs & imaging results that were available during my care of the patient were  reviewed by me and considered in my medical decision making (see chart for details).  Acute pain of the right shoulder, neck pain, acute right foot pain  X-ray completed of the foot, negative, reported to patient via telephone, deferring imaging of the neck and shoulders at this time, patient in agreement with plan of care, Toradol  IM given, as he has not attempted oral medicine will initiate treatment with over-the-counter  NSAIDs, discussed dosage and frequency, recommended supportive care with through RICE, compression given for the right foot, given written handout of stretches for the neck and the shoulders, walker referral given to orthopedics Final Clinical Impressions(s) / UC Diagnoses   Final diagnoses:  Acute foot pain, right  Acute pain of right shoulder  Neck pain     Discharge Instructions      Today you are evaluated for your foot and shoulder pain  On exam you have tenderness along the right side of your neck as well as in the shoulder blade, the neck pain is most likely your true source of injury and irritation as pain to this area can cause radiating pain into the shoulder blade, shoulder and the arm, pain in the neck and shoulder can cause nerve compression which is why you are experiencing numbness and tingling in your fingers  X-ray of your foot has been completed as the foot is a delicate area to ensure that you do not have a stress fracture, you will be notified of these results by phone  You have been given an injection of Toradol  which helps to reduce inflammation and helps with pain and ideally will start see some relief within the next 30 minutes to an hour  At home begin use of ibuprofen  taking 600 to 800 mg (3 to 4 tablets) every 6-8 hours consistently for the next 5 days to continue the process above, may take Tylenol  or using topical medicine as needed  May apply ice or heat over the affected area in 10 to 15-minute intervals  Recommend daily massaging and  stretching of the affected areas to further loosen the muscle, exercises inside of packet  May continue to use compression over the foot  May elevate the foot whenever sitting and lying  May cushion the body with pillows for general comfort and support  If your symptoms continue to persist may follow-up with urgent care or orthopedic specialist as needed, information sent and sent to     ED Prescriptions   None    PDMP not reviewed this encounter.   Teresa Shelba SAUNDERS, [place] 07/16/24 [phone]

## 2024-07-18 ENCOUNTER — Telehealth: Payer: Self-pay

## 2024-07-18 ENCOUNTER — Ambulatory Visit (HOSPITAL_COMMUNITY): Payer: Self-pay

## 2024-07-18 NOTE — Telephone Encounter (Signed)
 This would be a new patient to [facility].    Is this a good fit with [facility] or does he need to see someone else?  Copied from CRM [id number]. Topic: Appointments - Scheduling Inquiry for Clinic >> Jul 15, 2024 11:23 AM Donna BRAVO wrote: Reason for CRM: patient is looking to establish care. Patient would like to know which  provider would work best for him. 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 Patient is looking for a provider who will do adult testing for this issue.  Patient would like to speak with someone about this and has other questions he would liked answered. Patient phone [phone]  Patient was informed of turn around time frame of 1 business day.

## 2024-07-18 NOTE — Telephone Encounter (Signed)
 Sent to all providers taking new patients, awaiting response

## 2024-08-18 ENCOUNTER — Ambulatory Visit (INDEPENDENT_AMBULATORY_CARE_PROVIDER_SITE_OTHER): Payer: Self-pay | Admitting: Physician Assistant

## 2024-08-18 ENCOUNTER — Encounter: Payer: Self-pay | Admitting: Physician Assistant

## 2024-08-18 VITALS — BP 118/82 | HR 107 | Resp 14 | Ht 74.0 in | Wt 185.0 lb

## 2024-08-18 DIAGNOSIS — I214 Non-ST elevation (NSTEMI) myocardial infarction: Secondary | ICD-10-CM

## 2024-08-18 DIAGNOSIS — F331 Major depressive disorder, recurrent, moderate: Secondary | ICD-10-CM

## 2024-08-18 DIAGNOSIS — M545 Low back pain, unspecified: Secondary | ICD-10-CM

## 2024-08-18 DIAGNOSIS — F908 Attention-deficit hyperactivity disorder, other type: Secondary | ICD-10-CM | POA: Diagnosis not present

## 2024-08-18 DIAGNOSIS — R5383 Other fatigue: Secondary | ICD-10-CM

## 2024-08-18 DIAGNOSIS — K219 Gastro-esophageal reflux disease without esophagitis: Secondary | ICD-10-CM | POA: Diagnosis not present

## 2024-08-18 DIAGNOSIS — I252 Old myocardial infarction: Secondary | ICD-10-CM

## 2024-08-18 DIAGNOSIS — G8929 Other chronic pain: Secondary | ICD-10-CM

## 2024-08-18 DIAGNOSIS — F411 Generalized anxiety disorder: Secondary | ICD-10-CM | POA: Diagnosis not present

## 2024-08-18 DIAGNOSIS — F1011 Alcohol abuse, in remission: Secondary | ICD-10-CM
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
## 2024-08-19 DIAGNOSIS — F1011 Alcohol abuse, in remission: Secondary | ICD-10-CM | POA: Insufficient documentation
